# Patient Record
Sex: Male | Born: 1970 | Race: White | Hispanic: No | Marital: Single | Smoking: Current every day smoker
Health system: Southern US, Community
[De-identification: ages and names within clinical notes are randomized; demographics above are authoritative.]

## PROBLEM LIST (undated history)

## (undated) DIAGNOSIS — IMO0001 Reserved for inherently not codable concepts without codable children: Secondary | ICD-10-CM

## (undated) DIAGNOSIS — I219 Acute myocardial infarction, unspecified: Secondary | ICD-10-CM

## (undated) DIAGNOSIS — I2510 Atherosclerotic heart disease of native coronary artery without angina pectoris: Secondary | ICD-10-CM

## (undated) DIAGNOSIS — I10 Essential (primary) hypertension: Secondary | ICD-10-CM

## (undated) DIAGNOSIS — E7800 Pure hypercholesterolemia, unspecified: Secondary | ICD-10-CM

## (undated) DIAGNOSIS — I209 Angina pectoris, unspecified: Secondary | ICD-10-CM

## (undated) DIAGNOSIS — F17200 Nicotine dependence, unspecified, uncomplicated: Secondary | ICD-10-CM

## (undated) HISTORY — DX: Essential (primary) hypertension: I10

## (undated) HISTORY — DX: Reserved for inherently not codable concepts without codable children: IMO0001

## (undated) HISTORY — DX: Angina pectoris, unspecified: I20.9

## (undated) HISTORY — PX: OTHER SURGICAL HISTORY: SHX169

## (undated) HISTORY — DX: Atherosclerotic heart disease of native coronary artery without angina pectoris: I25.10

## (undated) HISTORY — DX: Acute myocardial infarction, unspecified: I21.9

## (undated) HISTORY — DX: Nicotine dependence, unspecified, uncomplicated: F17.200

## (undated) HISTORY — DX: Pure hypercholesterolemia, unspecified: E78.00

---

## 2006-09-11 ENCOUNTER — Inpatient Hospital Stay: Payer: Self-pay | Admitting: Internal Medicine

## 2010-03-02 ENCOUNTER — Emergency Department: Payer: Self-pay | Admitting: Emergency Medicine

## 2011-09-16 ENCOUNTER — Emergency Department: Payer: Self-pay | Admitting: *Deleted

## 2012-09-02 ENCOUNTER — Ambulatory Visit: Payer: Self-pay

## 2012-12-09 ENCOUNTER — Inpatient Hospital Stay: Payer: Self-pay | Admitting: Internal Medicine

## 2012-12-09 LAB — BASIC METABOLIC PANEL
Calcium, Total: 9.6 mg/dL (ref 8.5–10.1)
Co2: 31 mmol/L (ref 21–32)
Creatinine: 0.83 mg/dL (ref 0.60–1.30)
EGFR (African American): >60
Glucose: 119 mg/dL — ABNORMAL HIGH (ref 65–99)
Osmolality: 280 (ref 275–301)
Potassium: 3.5 mmol/L (ref 3.5–5.1)
Sodium: 139 mmol/L (ref 136–145)

## 2012-12-09 LAB — URINALYSIS, COMPLETE
Bacteria: NONE SEEN
Bilirubin,UR: NEGATIVE
Glucose,UR: NEGATIVE mg/dL (ref 0–75)
Ketone: NEGATIVE
Leukocyte Esterase: NEGATIVE
RBC,UR: NONE SEEN /HPF (ref 0–5)

## 2012-12-09 LAB — CBC
HCT: 44.1 % (ref 40.0–52.0)
HGB: 15.6 g/dL (ref 13.0–18.0)
MCV: 87 fL (ref 80–100)
Platelet: 328 10*3/uL (ref 150–440)
RBC: 5.04 10*6/uL (ref 4.40–5.90)
WBC: 22 10*3/uL — ABNORMAL HIGH (ref 3.8–10.6)

## 2012-12-09 LAB — CK TOTAL AND CKMB (NOT AT ARMC)
CK, Total: 855 U/L — ABNORMAL HIGH (ref 35–232)
CK-MB: 74.2 ng/mL — ABNORMAL HIGH (ref 0.5–3.6)

## 2012-12-09 LAB — TROPONIN I
Troponin-I: 4 ng/mL — ABNORMAL HIGH
Troponin-I: 26.03 ng/mL — ABNORMAL HIGH

## 2012-12-09 LAB — DRUG SCREEN, URINE
Amphetamines, Ur Screen: NEGATIVE (ref ?–1000)
Barbiturates, Ur Screen: NEGATIVE (ref ?–200)
Cocaine Metabolite,Ur ~~LOC~~: NEGATIVE (ref ?–300)
Phencyclidine (PCP) Ur S: NEGATIVE (ref ?–25)
Tricyclic, Ur Screen: NEGATIVE (ref ?–1000)

## 2012-12-09 LAB — APTT: Activated PTT: 29.8 secs (ref 23.6–35.9)

## 2012-12-10 LAB — BASIC METABOLIC PANEL
BUN: 11 mg/dL (ref 7–18)
Creatinine: 0.65 mg/dL (ref 0.60–1.30)
EGFR (African American): >60
Glucose: 100 mg/dL — ABNORMAL HIGH (ref 65–99)
Osmolality: 281 (ref 275–301)
Potassium: 3.3 mmol/L — ABNORMAL LOW (ref 3.5–5.1)
Sodium: 141 mmol/L (ref 136–145)

## 2012-12-25 ENCOUNTER — Emergency Department: Payer: Self-pay | Admitting: Emergency Medicine

## 2012-12-25 LAB — COMPREHENSIVE METABOLIC PANEL
Alkaline Phosphatase: 88 U/L (ref 50–136)
Anion Gap: 8 (ref 7–16)
BUN: 10 mg/dL (ref 7–18)
Calcium, Total: 8.5 mg/dL (ref 8.5–10.1)
Chloride: 112 mmol/L — ABNORMAL HIGH (ref 98–107)
Co2: 23 mmol/L (ref 21–32)
Creatinine: 0.55 mg/dL — ABNORMAL LOW (ref 0.60–1.30)
EGFR (African American): >60
EGFR (Non-African Amer.): >60
Osmolality: 284 (ref 275–301)
Potassium: 4 mmol/L (ref 3.5–5.1)
SGOT(AST): 22 U/L (ref 15–37)
SGPT (ALT): 31 U/L (ref 12–78)
Total Protein: 7.8 g/dL (ref 6.4–8.2)

## 2012-12-25 LAB — CBC
HGB: 14.3 g/dL (ref 13.0–18.0)
MCH: 30.2 pg (ref 26.0–34.0)
RBC: 4.75 10*6/uL (ref 4.40–5.90)
RDW: 13.7 % (ref 11.5–14.5)
WBC: 14.6 10*3/uL — ABNORMAL HIGH (ref 3.8–10.6)

## 2012-12-25 LAB — TROPONIN I: Troponin-I: <0.02 ng/mL

## 2012-12-25 LAB — ETHANOL: Ethanol: 258 mg/dL

## 2013-01-04 ENCOUNTER — Other Ambulatory Visit: Payer: Self-pay | Admitting: Internal Medicine

## 2013-01-04 LAB — CBC WITH DIFFERENTIAL/PLATELET
Basophil #: 0.1 10*3/uL (ref 0.0–0.1)
Basophil %: 1 %
Eosinophil #: 0.1 10*3/uL (ref 0.0–0.7)
Eosinophil %: 1.9 %
HCT: 40 % (ref 40.0–52.0)
HGB: 13.6 g/dL (ref 13.0–18.0)
Lymphocyte %: 28.5 %
MCH: 30.7 pg (ref 26.0–34.0)
MCHC: 33.9 g/dL (ref 32.0–36.0)
MCV: 91 fL (ref 80–100)
Monocyte #: 0.6 x10 3/mm (ref 0.2–1.0)
Neutrophil #: 4.5 10*3/uL (ref 1.4–6.5)
Platelet: 207 10*3/uL (ref 150–440)
RBC: 4.42 10*6/uL (ref 4.40–5.90)
RDW: 13.9 % (ref 11.5–14.5)
WBC: 7.3 10*3/uL (ref 3.8–10.6)

## 2013-01-04 LAB — COMPREHENSIVE METABOLIC PANEL
Albumin: 3.5 g/dL (ref 3.4–5.0)
Anion Gap: 7 (ref 7–16)
BUN: 10 mg/dL (ref 7–18)
Calcium, Total: 8.8 mg/dL (ref 8.5–10.1)
Chloride: 107 mmol/L (ref 98–107)
Co2: 28 mmol/L (ref 21–32)
Creatinine: 0.65 mg/dL (ref 0.60–1.30)
Glucose: 84 mg/dL (ref 65–99)
Osmolality: 281 (ref 275–301)
SGPT (ALT): 28 U/L (ref 12–78)
Sodium: 142 mmol/L (ref 136–145)
Total Protein: 7.1 g/dL (ref 6.4–8.2)

## 2013-01-04 LAB — LIPID PANEL
HDL Cholesterol: 74 mg/dL — ABNORMAL HIGH (ref 40–60)
Triglycerides: 79 mg/dL (ref 0–200)
VLDL Cholesterol, Calc: 16 mg/dL (ref 5–40)

## 2013-01-06 ENCOUNTER — Ambulatory Visit: Payer: Self-pay | Admitting: Internal Medicine

## 2013-01-06 LAB — CK TOTAL AND CKMB (NOT AT ARMC): CK-MB: <0.5 ng/mL — ABNORMAL LOW (ref 0.5–3.6)

## 2013-01-07 LAB — BASIC METABOLIC PANEL
Anion Gap: 7 (ref 7–16)
Calcium, Total: 8.7 mg/dL (ref 8.5–10.1)
Chloride: 108 mmol/L — ABNORMAL HIGH (ref 98–107)
Co2: 25 mmol/L (ref 21–32)
Creatinine: 0.66 mg/dL (ref 0.60–1.30)
EGFR (African American): >60
Sodium: 140 mmol/L (ref 136–145)

## 2015-03-24 NOTE — H&P (Signed)
PATIENT NAME:  Jordan [name], Jordan [name] MR#:  [id number] DATE OF BIRTH:  03/10/1971  DATE OF ADMISSION:  12/09/2012  PRIMARY CARE PHYSICIAN: Nonlocal.  REFERRING PHYSICIAN: Dr. Zenda Alpers.  CHIEF COMPLAINT: Chest pressure.  HISTORY OF PRESENT ILLNESS: The patient is a 44 year old Caucasian male with a past medical history of hypertension and nicotine dependence, was in his usual state of health until last night. The patient slept after his dinner and woke up in the middle of the night at around 1:00 a.m. with chest pressure. He felt like elephant was sitting on his chest, associated with nausea and dizziness. He has a little bit of shortness of breath, also and chest pressure is radiating to the left arm. The patient was brought into the ER and his 12-lead EKG showed normal sinus rhythm without any ST depressions or elevations. CK total was elevated at 385, CPK-MB 34, troponin-T of 4.0. Repeat 12-lead EKG was still in normal sinus rhythm without any ST depressions or elevations. The patient was diagnosed as non-STEMI. He was started on oxygen. Sublingual nitroglycerin was given, but the chest pressure was not relieved, so nitro paste 1 inch was applied to the anterior chest wall by the ER physician. Heparin drip was started and call was placed to Dr. Juliann Pares, on-call cardiologist.Hospitalist team is called to admit the patient.   During my examination, the patient was still having severe chest pressure, 8 to 9 out of 10. Another sublingual nitroglycerin was given and another inch of nitro paste is ordered. Morphine 4 mg IV was pushed and another 4 mg of morphine was ordered as the patient was having severe chest pressure. After giving a total of 2 inches of nitro paste and morphine 4 mg IV, his chest pressure was improved and he also reported that it was 2 to 3 out of 10. Nitroglycerin drip was also ordered for complete resolution of his chest pressure. As the patient is complaining of chest pain with deep inspirations,  stat CT angiogram of the chest is ordered to rule out pulmonary embolism. Also, the plan is to rule out dissection as per my discussion with Dr. Juliann Pares. The plan is to take him to cardiac cath if the CT chest is negative. At this time, the patient was taken to radiology department for stat CT chest. The patient denies any vomiting. His girlfriend or his wife is at bedside.   PAST MEDICAL HISTORY: Hypertension, low back pain.   PAST SURGICAL HISTORY: None.  ALLERGIES: The patient has no known drug allergies.   HOME MEDICATIONS: Hydrochlorothiazide 50 mg once daily.   PSYCHOSOCIAL HISTORY: He smokes one-half pack a day. Denies any alcohol. Denies any street drugs.   FAMILY HISTORY: Father has history of congestive heart failure and 3 heart surgeries.  REVIEW OF SYSTEMS: CONSTITUTIONAL: Denies any fever or fatigue. Complaining of weakness, severe chest pressure. Positive severe chest pressure. Denies weight loss, weight gain.  EYES: Denies any blurry vision, cataracts, glaucoma.  ENT: Denies tinnitus, ear pain, discharge, postnasal drip, sinus pain, difficulty in swallowing.  RESPIRATORY: Denies cough, wheezing, but complaining of painful respiration with deep inspirations. Denies COPD or pneumonia.  CARDIOVASCULAR: Complaining of midsternal chest pressure radiating to the neck and shoulder. Denies any edema. Denies palpitations, syncope or varicose veins.  GASTROINTESTINAL: Complaining of nausea. Denies vomiting, diarrhea, abdominal pain, hematemesis, melena, constipation or GERD. GENITOURINARY: Denies dysuria, hematuria, renal calculi.  ENDOCRINOLOGY: Denies polyuria, polyphagia, nocturia, thyroid problems. HEMATOLOGIC/LYMPHATIC: Denies anemia, bleeding, bruising.  MUSCULOSKELETAL: Chronic low back pain is present.  Denies any swelling, gout, redness.  NEUROLOGIC: Denies any numbness, weakness, vertigo, ataxia, dementia.  PSYCHIATRIC: Denies insomnia, ADD, OCD, anxiety.   PHYSICAL  EXAMINATION:  VITAL SIGNS: Temperature 96.7, pulse 77, respirations 18, blood pressure 120/80, pulse ox 100% on 4 liters of oxygen.  GENERAL APPEARANCE: Uncomfortable from chest pressure which is significantly improved after 2 inches of nitro paste and right now pressure/pain is 2 to 3 out of 10. HEENT: Normocephalic, atraumatic. Pupils are equally reacting to light and accommodation. The patient is moderately built and moderately nourished. No conjunctival injection. No scleral icterus. ENT: No postnasal drip. No sinus tenderness. No pharyngeal edema.  NECK: Supple. No JVD. No carotid bruits. No thyromegaly.  LUNGS: Clear to auscultation bilaterally. No accessory muscle usage. No anterior chest wall tenderness on palpation.  CARDIAC: S1, S2 normal. Regular rate and rhythm.  GASTROINTESTINAL: Soft. Bowel sounds are positive in all four quadrants. Nontender, nondistended. No masses present.  NEUROLOGIC: Awake, alert and oriented x 3. Reflexes are 2+. Motor and sensory are grossly intact.  EXTREMITIES: No edema. No cyanosis. No clubbing.  SKIN: Normal turgor. Warm to touch. No lesions, no acne, no rash.  MUSCULOSKELETAL: Positive back pain. Denies any pain or hip pain.  PSYCHIATRIC: The patient seemed to be anxious from chest pressure. Otherwise, normal affect and mood.  LABORATORY DATA AND IMAGING STUDIES: 12-lead EKG revealed normal sinus rhythm at 54 beats per minute, normal PR and QRS intervals, no ST-T wave change. No ST elevations or depressions were noticed. Inferior infarct which is age undetermined. Glucose 119, BUN 15, creatinine 0.83, sodium 139, potassium 3.5, chloride 101, CO2 of 31, anion gap 7, GFR greater than 60, serum osmolality 280, calcium 9.6. CK total 383, CPK-MB 34.0, troponin-T 4.0. WBC 22, hemoglobin 15.6, hematocrit 44.1, platelet count is 328, MCV 87. Activated PTT 29.8. Urinalysis and urine drug screen are pending. A stat CT angiogram of the chest is ordered. The patient is in  the radiology department, the result is pending at this time.   ASSESSMENT AND PLAN: A 44 year old Caucasian male presenting to the ER with severe substernal chest pressure will be admitted with the following assessment and plan.   1. Non-STEMI. Will admit the patient to CCU. ACS protocol with oxygen, nitroglycerin, aspirin, beta blocker, statin are ordered. Integrilin drip is initiated. Heparin drip is ordered by the ER physician. Nitroglycerin drip if the chest pressure is still not relieved with nitroglycerin 2 inches paste. Stat CT angiogram of the chest is ordered to rule out pulmonary embolism as well as a dissection. If CT angiogram negative, the patient will be taken to cardiac cath immediately by Dr. Juliann Paresallwood. I personally discussed with Dr. Juliann Paresallwood and he is aware of the situation.  2. Urine drug screen and urinalysis are pending.  3. Hypertension. Blood pressure is stable at this time. The patient is receiving IV fluids. He is n.p.o.  4. Nicotine dependence. The patient needs counseling once acute chest pressure is resolved.  5. Chronic low back pain, stable. Pain management as needed.  6. GI prophylaxis with Protonix.  7. DVT prophylaxis. The patient is on heparin drip. 8. He is full code.  9. Cycle cardiac biomarkers.  The diagnosis and plan of care was discussed in detail with the patient and his wife at bedside.   Total critical care time spent is 75 minutes.     ____________________________ Ramonita LabAruna Roniesha Hollingshead, MD ag:es D: 12/09/2012 07:32:14 ET T: 12/09/2012 08:08:33 ET JOB#: [id number]  cc: Ramonita LabAruna Stanislaus Kaltenbach, MD, <Dictator>  Dwayne D. Juliann Pares, MD Ramonita Lab MD ELECTRONICALLY SIGNED 12/17/2012 7:20

## 2015-03-24 NOTE — Consult Note (Signed)
PATIENT NAME:  Jordan [name], ORSAK MR#:  [id number] DATE OF BIRTH:  10/14/1971  DATE OF CONSULTATION:  12/10/2012  REFERRING PHYSICIAN:  Dr. Vear Clock, referred by the hospitalist CONSULTING PHYSICIAN:  Aidee Latimore D. Cheryal Salas, MD  INDICATION: Non-Q-wave myocardial infarction, probably inferior wall myocardial infarction, with unstable angina.   HISTORY OF PRESENT ILLNESS: The patient is a 44 year old white male with a history of smoking, hypertension, has had two episodes of severe chest pain. A few days ago he had substernal chest pressure while asleep.  The pain was off and on, but he did not seek medical attention at this time. While he was asleep, he had some significant chest pain around 1:00 a.m., chest pressure, felt like an elephant on his chest, associated with nausea and dizziness. He got a little bit short of breath. Pain was 10 out of 10. This was his second episode. He finally came to the Emergency Room for evaluation. He denied any prior cardiac history. EKG showed nonspecific ST-T wave changes with some ST depression, with elevated troponin and elevated CKs, so he was advised to be admitted for further evaluation. He was treated with anticoagulation and aspirin, pain control with narcotics as necessary, and admission to the Intensive Care Unit.   REVIEW OF SYSTEMS: He denies significant blackout spells or syncope. He has had nausea, no real vomiting. No fever, chills, or sweats. No weight loss, no weight gain, hemoptysis, hematemesis. He denies bright red blood per rectum. No vision change or hearing change. Denies sputum production. Denies cough.   PAST MEDICAL HISTORY: Hypertension, smoking, mild obesity.   PAST SURGICAL HISTORY: None.   MEDICATIONS: He is on HCTZ 50 mg a day.   ALLERGIES: None.   SOCIAL HISTORY: Married, smoker. He works in Aeronautical engineer.   FAMILY HISTORY: Positive for coronary artery disease, angina, coronary artery bypass surgery, congestive heart failure.  PHYSICAL  EXAMINATION: VITAL SIGNS: Blood pressure 120/80, pulse 75, respiratory rate 16, afebrile.  HEENT: Normocephalic, atraumatic. Pupils are equal and reactive to light.  NECK: Supple. No significant jugular venous distention, bruits, or adenopathy.  LUNGS: Clear to auscultation and percussion. No significant wheeze, rhonchi, or rales.  HEART: Regular rate and rhythm. Systolic ejection murmur at left sternal border. Positive S3. Soft S4. PMI is nondisplaced. laterally   benign.  ABDOMEN: Positive bowel sounds. No rebound, guarding, or tenderness.  NEUROLOGIC: Intact.  SKIN: Normal.   LABORATORY, DIAGNOSTIC AND RADIOLOGICAL DATA:  EKG: Normal sinus rhythm, rate of 60, diffuse nonspecific ST-T wave changes, possible inferior infarct, age unclear. This may be related to this event.   Glucose 119, BUN 15, creatinine 0.83, sodium 139, potassium 3.5, chloride 109, calcium 96, CK 383, MB 34. Troponin 4, white count of 22, hemoglobin 15.6, hematocrit 44, platelet count 328, PT, PTT normal. Urinalysis unremarkable. CT of the chest unremarkable.   ASSESSMENT: Non-STEMI, unstable angina, angina, probable coronary artery disease, abnormal EKG, elevated cardiac enzymes, smoking, hypertension.    PLAN:   1. I agree with admit, place in intensive care unit, rule out for myocardial infarction. Follow-up cardiac enzymes. Follow-up EKG. Anticoagulation with heparin. I would probably add Integrilin because of increased cardiac enzymes. Nitrates as necessary for pain. Narcotics to help with pain syndrome at this point. I will probably proceed with echocardiogram and consider cardiac cath within 24 to 48 hours.  2. Hypertension:  Continue hypertension control, probably switch from HCTZ and place on beta blocker, probably ACE inhibitor if he can tolerate it.  3. I advised the patient to  quit smoking. Nicotine patch for now.  4. Chronic low back pain: Continue pain management as necessary      Consider drug screen as  this is a young patient with a cardiac event. Again, we will consider statin therapy, proceed with further evaluation and management of what appears to be acute coronary syndrome.   ____________________________ Bobbie Stackwayne D. Juliann Paresallwood, MD ddc:cb D: 12/10/2012 10:06:06 ET T: 12/10/2012 10:22:54 ET JOB#: [id number]  cc: Tayja Manzer D. Juliann Paresallwood, MD, <Dictator> Alwyn PeaWAYNE D Diesha Rostad MD ELECTRONICALLY SIGNED 12/31/2012 16:32

## 2015-03-24 NOTE — Consult Note (Signed)
Chief Complaint:   Subjective/Chief Complaint Pt states to be doing better today. He denies sob or CP. He ambulated well. No groin issues.   VITAL SIGNS/ANCILLARY NOTES: **Vital Signs.:   09-Jan-14 07:00   Vital Signs Type Routine   Temperature Temperature (F) 97.7   Celsius 36.5   Temperature Source oral   Pulse Pulse 70   Respirations Respirations 20   Systolic BP Systolic BP 500   Diastolic BP (mmHg) Diastolic BP (mmHg) 75   Mean BP 87   Pulse Ox % Pulse Ox % 99   Pulse Ox Activity Level  At rest   Oxygen Delivery Room Air/ 21 %   Pulse Ox Heart Rate 72  *Intake and Output.:   09-Jan-14 07:00   Grand Totals Intake:  13.8 Output:      Net:  13.8 24 Hr.:  1895.2   Integrelin      In:  13.8   Brief Assessment:   Cardiac Regular  murmur present  -- LE edema  -- JVD  --Gallop    Respiratory normal resp effort  clear BS    Gastrointestinal Normal    Gastrointestinal details normal Soft  No rigidity    Additional Physical Exam Right groin ok   Lab Results: Routine Chem:  09-Jan-14 04:43    Glucose, Serum  100   BUN 11   Creatinine (comp) 0.65   Sodium, Serum 141   Potassium, Serum  3.3   Chloride, Serum  110   CO2, Serum 24   Calcium (Total), Serum  8.4   Anion Gap 7   Osmolality (calc) 281   eGFR (African American) >60   eGFR (Non-African American) >60 (eGFR values <85m/min/1.73 m2 may be an indication of chronic kidney disease (CKD). Calculated eGFR is useful in patients with stable renal function. The eGFR calculation will not be reliable in acutely ill patients when serum creatinine is changing rapidly. It is not useful in  patients on dialysis. The eGFR calculation may not be applicable to patients at the low and high extremes of body sizes, pregnant women, and vegetarians.)   Cholesterol, Serum 146   Triglycerides, Serum 142   HDL (INHOUSE) 44   VLDL Cholesterol Calculated 28   LDL Cholesterol Calculated 74 (Result(s) reported on 10 Dec 2012 at  06:17AM.)   Radiology Results: XRay:    08-Jan-14 04:30, Chest PA and Lateral   Chest PA and Lateral    REASON FOR EXAM:    chest pain  COMMENTS:   LMP: (Male)    PROCEDURE: DXR - DXR CHEST PA (OR AP) AND LATERAL  - Dec 09 2012  4:30AM     RESULT: Comparison: None    Findings:     PA and lateral chest radiographs are provided.  There is no focal   parenchymal opacity, pleural effusion, or pneumothorax. The heart and   mediastinum are unremarkable.  The osseous structures are unremarkable.    IMPRESSION:   No acute disease of the chest.    Dictation Site: 1        Verified By: HJennette Banker M.D., MD  Cardiac Catherization:    08-Jan-14 10:03, Cardiac Catheterization   Cardiac Catheterization    [facility] [address] [place] [id number] ([phone]    Cardiovascular Catheterization Comprehensive Report     Patient: RDanish Ruffins Study date: 12/09/2012  MR number: [id number] Account number: [id number]    DOB: [birth year]  Age: 44 years  Gender: Male  Race: White  Height: 66.1 in  Weight: 189.6 lb     Diagnostic Cardiologist:  Lujean Amel, MD  Interventional Cardiologist:  Lujean Amel, MD     SUMMARY:     -CARDIAC STRUCTURES: Analysis of regional contractile function  demonstrated mild diaphragmatic hypokinesis and moderate posterobasal  hypokinesis.     -1ST LESION INTERVENTIONS: A drug-eluting stent was performed on the  100 % lesion in the mid RCA. Following intervention there was a 0 %  residual stenosis.     -Summary: Preseverd overall LVF  EF=55%  Inf /Post hypo  Cors  L[place] OK  LAD 75/75 mid LAD  Circ 40 mid  RCA 100 mid  Successful PCI stent to mid RCA 100->0%  4.0-28mm post dilated with 5.0-12mm Clever trex to 22atm  Consider staging mid LAD     CORONARY CIRCULATION: The coronary circulation is right dominant. Mid  LAD: There was a tubular 75 % stenosis. Distal LAD: There was a 75 %  stenosis. Proximal  circumflex: There was a 40 % stenosis. Mid RCA:  There was a 100 % stenosis.     VENTRICLES:Analysis of regional contractile function demonstrated  mild diaphragmatic hypokinesis and moderate posterobasal hypokinesis.     HISTORY: No history of previous myocardial infarction. There was no  prior diagnosis of congestive heart failure. The patient has  hypertension, a history of current cigarette use, and a family  history of coronary artery disease. There was no history of  cerebrovascular disease, peripheral arterial disease, chronic lung  disease, or diabetes. PRIOR CARDIOVASCULAR PROCEDURES: No history of  valve surgery, coronary or graft percutaneous intervention, or  coronary bypass surgery.     PRIOR DIAGNOSTIC TEST RESULTS: No prior stress test is available.     PROCEDURES PERFORMED: Left heart catheterization with  ventriculography. Procedure: Successful Closure with Mynx  Intervention on mid RCA: drug-eluting stent.     PROCEDURE: The risks and alternatives of the procedures and conscious  sedation were explained to the patient and informed consent was  obtained. The patient was brought to the cath lab and placed on the  table. The planned puncture sites were prepped and draped in the  usual sterile fashion.     -ACT measurement.     -ACT measurement.     -Right femoral artery access. The puncture site was infiltrated with  20 ml 1 % lidocaine. The vessel was accessed using the modified  Seldinger technique, a wire was threaded into the vessel, and a 5 Fr  x 11 cm Avanti sheath was advanced over the wire into the vessel.     -Left heart catheterization. A catheter was advanced to the ascending  aorta. Ventriculography was performed using power injection of  contrast agent.     -Successful Closure with Mynx.     LESION INTERVENTION: A drug-eluting stent was performed on the 100 %  lesion in the mid RCA. Following intervention there was a 0 %  residual stenosis.      -Vessel setup was performed. A HTF BMW .014 x 190cm wire was used to  cross the lesion.     -Vessel setup was performed. A 26F JR 4 guiding catheter was used to  intubate the vessel.     -Balloonangioplasty was performed, using a Rx Trek 3.0 x 27m  balloon, with 3 inflation(s) and a maximum inflation pressure of 14  atm.     -A Xience EX 4.0 x 28MM drug-eluting stent  was placed across the  lesion and deployed at a maximum inflation pressureof 11 atm.     -Balloon angioplasty was performed, with 2 inflation(s) and a maximum  inflation pressure of 18 atm.     -Balloon angioplasty was performed, using a [place] Trek 5.0 x 58m  balloon, with 3 inflation(s) and a maximum inflation pressure of 22  atm.     PROCEDURE COMPLETION: TIMING: Test started at 10:16. Test concluded at  11:13. RADIATION EXPOSURE: Fluoroscopy time: 15.8 min. Fluoroscopy  dose: 4.92 Gray.  MEDICATIONS GIVEN: Fentanyl, 50 mcg, IV, at 10:15. Midazolam, 1 mg,  IV, at 10:16. Midazolam, 1 mg, IV, at 10:19. Fentanyl, 50 mcg, IV, at  10:19. Nitroglycerin, 100 mcg, intracoronary, at 10:36.  Nitroglycerin, 200 mcg, intracoronary, at 11:02. Heparin, 3000 units,  IV, last dose at 10:36. Eptifibatide Bolus, 7.9 ml, at 10:40.  Ticagrelor (Brilinta), 180 mg, PO, at 11:16.  CONTRAST GIVEN: Isovue 335 ml.     Prepared and signed by     DLujean Amel MD  Signed 12/09/2012 14:15:20     STUDY DIAGRAM     Angiographic findings  Native coronary lesions:   Mid LAD: Lesion 1: tubular, 75 % stenosis.   Distal LAD: Lesion 1: 75 % stenosis.   Proximal circumflex: Lesion 1: 40 % stenosis.   Mid RCA: Lesion 1: 100 % stenosis.  Intervention results  Native coronary lesions:  drug-eluting stent of the 100 % stenosis in mid RCA. 0 % residual  stenosis. Stent: Xience EX 4.0 x 28MM drug-eluting.     HEMODYNAMIC TABLES     Pressures:  Baseline  Pressures:  - HR: 76  Pressures:  - Rhythm:  Pressures:  -- Aortic Pressure (S/D/M):  96/56/71  Pressures:  -- Left Ventricle (s/edp): 100/17/--     Outputs:  Baseline  Outputs:  -- CALCULATIONS[age]  Outputs:  -- CALCULATIONS: Body Surface Area: 1.96  Outputs:  -- CALCULATIONS: Height in cm: 168.00  Outputs:  -- CALCULATIONS: Sex: Male  Outputs:  -- CALCULATIONS: Weight in kg: 86.20  Cardiology:    08-Jan-14 04:16, ECG   Ventricular Rate 69   Atrial Rate 69   P-R Interval 120   QRS Duration 86   QT 386   QTc 413   P Axis 63   R Axis 0   T Axis 57   ECG interpretation    Normal sinus rhythm  Possible Inferior infarct , age undetermined  Abnormal ECG  When compared with ECG of 16-Sep-2011 09:15,  Borderline criteria for Inferior infarct are now Present  ST elevation has replaced ST depression in Inferior leads  ----------unconfirmed----------  Confirmed by OVERREAD, NOT ([id number]), editor PEARSON, BARBARA ([id number]) on 12/10/2012 8:47:59 AM   ECG     08-Jan-14 05:27, ECG   Ventricular Rate 64   Atrial Rate 64   P-R Interval 126   QRS Duration 84   QT 410   QTc 422   P Axis 47   R Axis -13   T Axis 71   ECG interpretation    Normal sinus rhythm  Inferior infarct , age undetermined  Abnormal ECG  When compared with ECG of 16-Sep-2011 09:15,  PR interval has increased  Vent. rate has decreased BY  36 BPM  Inferior infarct is now Present  ----------unconfirmed----------  Confirmed by OVERREAD, NOT ([id number]), editor PEARSON, BARBARA ([id number] on 12/10/2012 8:48:05 AM   ECG     08-Jan-14 07:40, ECG   Ventricular Rate 64  Atrial Rate 64   P-R Interval 126   QRS Duration 88   QT 418   QTc 431   P Axis 45   R Axis -6   T Axis 69   ECG interpretation    Normal sinus rhythm  Inferior infarct (cited on or before 16-Sep-2011)  Abnormal ECG  When compared with ECG of 09-Dec-2012 05:27,  Nonspecific T wave abnormality, worse in Inferior leads  Nonspecific T wave abnormality, worse in Lateral leads  ----------unconfirmed----------  Confirmed by OVERREAD,  NOT ([id number]), editor PEARSON, BARBARA ([id number]) on 12/10/2012 8:48:59 AM   ECG   CT:    08-Jan-14 07:21, CT Chest for Pulm Embolism With Contrast   CT Chest for Pulm Embolism With Contrast    REASON FOR EXAM:    CHEST PAIN R/O PE and dissection  COMMENTS:       PROCEDURE: CT  - CT CHEST (FOR PE) W  - Dec 09 2012  7:21AM     RESULT: Axial CT scanning was performed through the chest with   reconstructions at 3 mm intervals and slice thicknesses. The patient   received 100 cc of Isovue-370. Review of multiplanar reconstructed images   was performed separately on the VIA monitor.    The caliber of the thoracic aorta is normal. There is no evidence of a   false lumen. Contrast within the pulmonary arterial tree is normal. There   are no filling defects to suggest an acute pulmonary embolism. The   cardiac chambers are normal in size. There is no mediastinal nor hilar   lymphadenopathy. There is no pleural nor pericardial effusion. The     thoracic esophagus is of normal caliber.    At lung window settings there are no interstitial nor alveolar   infiltrates. No pulmonary parenchymal masses are demonstrated. Within the   upper abdomen the observed portions of the liver spleen and gallbladder   appear normal. There are no adrenal masses.    The thoracic vertebral bodies are preserved in height. The sternum   exhibits no acute abnormality. No acute rib abnormality is demonstrated.    IMPRESSION:   1. There is no evidence of an acute pulmonary embolism nor acute thoracic   aortic pathology.  2. There is no evidence of CHF nor of pneumonia.  3. There is no evidence of a pneumothorax nor pleural effusion.  4. The bony thorax exhibits no acute abnormality.     Dictation Site: 1        Verified By: DAVID A. Martinique, M.D., MD   Assessment/Plan:  Invasive Device Daily Assessment of Necessity:   Does the patient currently have any of the following indwelling devices? none   Assessment/Plan:    Assessment IMP S/P IMI non stemi [place] Angina HTN Smoking DJD back S/P PCI stent to mid RCA CAD Hyperlipidemia .    Plan PLAN Ambulate in the hall Agree with coreg 3.125 mg twice a day asa 81 mg once a day Plavix 75 mg once a day Statin for chol Advise to quit smoking Consider staging 75/75 mid LAD F/U outpt 1-2 weeks RTW 1-2 weeks   Electronic Signatures: Lujean Amel D (MD)  (Signed 09-Jan-14 12:49)  Authored: Chief Complaint, VITAL SIGNS/ANCILLARY NOTES, Brief Assessment, Lab Results, Radiology Results, Assessment/Plan   Last Updated: 09-Jan-14 12:49 by Lujean Amel D (MD)

## 2015-03-24 NOTE — Discharge Summary (Signed)
PATIENT NAME:  Jordan [name], Jordan [name] MR#:  [id number] DATE OF BIRTH:  [birth year]  DATE OF ADMISSION:  12/09/2012 DATE OF DISCHARGE:  12/10/2012  ADMISSION DIAGNOSIS: Non-ST-elevation myocardial infarction.   DISCHARGE DIAGNOSIS: Non-ST-elevation myocardial infarction.   CONSULTS: Dr. Juliann Paresallwood.   PROCEDURES: The patient underwent a cardiac catheterization on 12/09/2012 which showed EF of 55%. He had a 100% lesion in the mid-RCA. Following a drug-eluting stent, there is 0% residual stenosis. LAD had75% stenosis, distal LAD 75% stenosis, proximal circumflex 40% stenosis.   LDL was 74, VLDL 28, HDL 44, triglycerides 142, cholesterol 146. Sodium 141, potassium 3.3, chloride 110, bicarb 24, BUN 11, creatinine 0.65. Troponin max was 20.   HOSPITAL COURSE: This is a 44 year old male who presented with chest pain. For further details, please refer to H and P.   1. Non-ST-elevation MI. The patient's troponin was at 20 and probably still climbing. He underwent a cardiac catheterization which revealed 100% lesion in the RCA. His LAD also with 75% occlusion. He received a drug-eluting stent, was placed on Integrilin initially, transitioned to Plavix and aspirin, as well as statin and low-dose beta blocker. The patient was seen by Dr. Juliann Paresallwood. The patient will have followup in one week with Dr. Juliann Paresallwood.  2. Urine drug screen was positive for benzos and marijuana.  3. Hypertension. Blood pressure is stable.  4. Nicotine dependence. The patient does want to quit smoking. He was counseled for 3 minutes and discharged on a patch. C 5. Chronic lower back pain which is stable.   DISCHARGE MEDICATIONS:  1. Nitroglycerin sublingual 0.4 mg p.r.n. chest pain.  2. Simvastatin 10 mg at bedtime.  3. Aspirin 81 mg daily. 4. Plavix 75 mg daily.  5. Coreg 3.125 b.i.d.  6. Nicotine patch 21 mg per 24 hours.   DISCHARGE DIET: Low sodium.   DISCHARGE ACTIVITY: No exertion, no heavy lifting until followup with Dr. Juliann Paresallwood.  He may remove his dressing in the a.m. and may shower in the a.m.   TIME SPENT: Approximately 35 minutes.     ____________________________ Janyth ContesSital P. Juliene PinaMody, MD spm:es D: 12/10/2012 13:41:35 ET T: 12/10/2012 15:43:36 ET JOB#: [id number]  cc: Zaydyn Havey P. Juliene PinaMody, MD, <Dictator> Dwayne D. Juliann Paresallwood, MD Janyth ContesSITAL P Kristoffer Bala MD ELECTRONICALLY SIGNED 12/10/2012 19:50

## 2016-02-22 ENCOUNTER — Inpatient Hospital Stay: Payer: Managed Care, Other (non HMO)

## 2016-02-22 ENCOUNTER — Encounter: Payer: Self-pay | Admitting: Oncology

## 2016-02-22 ENCOUNTER — Inpatient Hospital Stay: Payer: Managed Care, Other (non HMO) | Attending: Oncology | Admitting: Oncology

## 2016-02-22 VITALS — BP 183/99 | HR 87 | Temp 98.3°F | Resp 18 | Ht 67.32 in | Wt 192.5 lb

## 2016-02-22 DIAGNOSIS — R591 Generalized enlarged lymph nodes: Secondary | ICD-10-CM | POA: Diagnosis not present

## 2016-02-22 DIAGNOSIS — Z79899 Other long term (current) drug therapy: Secondary | ICD-10-CM | POA: Insufficient documentation

## 2016-02-22 DIAGNOSIS — D72829 Elevated white blood cell count, unspecified: Secondary | ICD-10-CM | POA: Diagnosis not present

## 2016-02-22 DIAGNOSIS — E7800 Pure hypercholesterolemia, unspecified: Secondary | ICD-10-CM | POA: Diagnosis not present

## 2016-02-22 DIAGNOSIS — F17210 Nicotine dependence, cigarettes, uncomplicated: Secondary | ICD-10-CM | POA: Diagnosis not present

## 2016-02-22 DIAGNOSIS — Z7982 Long term (current) use of aspirin: Secondary | ICD-10-CM | POA: Insufficient documentation

## 2016-02-22 DIAGNOSIS — I10 Essential (primary) hypertension: Secondary | ICD-10-CM | POA: Diagnosis not present

## 2016-02-22 DIAGNOSIS — R599 Enlarged lymph nodes, unspecified: Secondary | ICD-10-CM

## 2016-02-22 LAB — COMPREHENSIVE METABOLIC PANEL
ALBUMIN: 4.4 g/dL (ref 3.5–5.0)
ALK PHOS: 85 U/L (ref 38–126)
ALT: 37 U/L (ref 17–63)
AST: 30 U/L (ref 15–41)
Anion gap: 8 (ref 5–15)
BILIRUBIN TOTAL: 0.6 mg/dL (ref 0.3–1.2)
BUN: 11 mg/dL (ref 6–20)
CALCIUM: 8.8 mg/dL — AB (ref 8.9–10.3)
CO2: 24 mmol/L (ref 22–32)
Chloride: 101 mmol/L (ref 101–111)
Creatinine, Ser: 0.75 mg/dL (ref 0.61–1.24)
GFR calc Af Amer: >60 mL/min (ref >60)
GFR calc non Af Amer: >60 mL/min (ref >60)
GLUCOSE: 103 mg/dL — AB (ref 65–99)
POTASSIUM: 3.7 mmol/L (ref 3.5–5.1)
SODIUM: 133 mmol/L — AB (ref 135–145)
TOTAL PROTEIN: 7.8 g/dL (ref 6.5–8.1)

## 2016-02-22 LAB — CBC WITH DIFFERENTIAL/PLATELET
BASOS ABS: 0.1 10*3/uL (ref 0–0.1)
BASOS PCT: 1 %
Eosinophils Absolute: 0.1 10*3/uL (ref 0–0.7)
Eosinophils Relative: 1 %
HEMATOCRIT: 48.7 % (ref 40.0–52.0)
HEMOGLOBIN: 17.1 g/dL (ref 13.0–18.0)
Lymphocytes Relative: 17 %
Lymphs Abs: 1.9 10*3/uL (ref 1.0–3.6)
MCH: 30 pg (ref 26.0–34.0)
MCHC: 35.2 g/dL (ref 32.0–36.0)
MCV: 85.4 fL (ref 80.0–100.0)
Monocytes Absolute: 0.7 10*3/uL (ref 0.2–1.0)
Monocytes Relative: 7 %
NEUTROS ABS: 8.1 10*3/uL — AB (ref 1.4–6.5)
NEUTROS PCT: 74 %
Platelets: 257 10*3/uL (ref 150–440)
RBC: 5.71 MIL/uL (ref 4.40–5.90)
RDW: 13 % (ref 11.5–14.5)
WBC: 10.9 10*3/uL — AB (ref 3.8–10.6)

## 2016-02-22 LAB — LACTATE DEHYDROGENASE: LDH: 164 U/L (ref 98–192)

## 2016-02-22 NOTE — Progress Notes (Signed)
Patient is [facility] for Axillary adenopathy and Inguinal lymphadenopathy he has been having for over a month.  He reports that his constant pain is 10/10 on pain scale.

## 2016-02-27 ENCOUNTER — Ambulatory Visit
Admission: RE | Admit: 2016-02-27 | Discharge: 2016-02-27 | Disposition: A | Discharge status: Home / Self Care | Payer: Managed Care, Other (non HMO) | Source: Ambulatory Visit | Attending: Oncology | Admitting: Oncology

## 2016-02-27 DIAGNOSIS — K760 Fatty (change of) liver, not elsewhere classified: Secondary | ICD-10-CM | POA: Insufficient documentation

## 2016-02-27 DIAGNOSIS — K8020 Calculus of gallbladder without cholecystitis without obstruction: Secondary | ICD-10-CM | POA: Diagnosis not present

## 2016-02-27 DIAGNOSIS — R599 Enlarged lymph nodes, unspecified: Secondary | ICD-10-CM

## 2016-02-27 DIAGNOSIS — R591 Generalized enlarged lymph nodes: Secondary | ICD-10-CM | POA: Diagnosis present

## 2016-02-27 LAB — COMP PANEL: LEUKEMIA/LYMPHOMA

## 2016-02-27 MED ORDER — IOPAMIDOL (ISOVUE-370) INJECTION 76%
100.0000 mL | Freq: Once | INTRAVENOUS | Status: AC | PRN
  Administered 2016-02-27: 100 mL via INTRAVENOUS

## 2016-02-28 ENCOUNTER — Telehealth: Payer: Self-pay | Admitting: *Deleted

## 2016-02-28 NOTE — Telephone Encounter (Signed)
Called to ask for results from labs drawn last week ( he has fu 4/6 for results) and to ask for a note for work since he has been out for so long.

## 2016-02-29 NOTE — Telephone Encounter (Signed)
Lab work and CT look ok. I still need to complete his note.

## 2016-02-29 NOTE — Telephone Encounter (Signed)
Per Dr Harland GermanFiinnegan, can only give a note for the day he was seen in office and day he had CT done. I called pt who states he went back to work today for a few hours and was told he does not need a note.Marland Kitchen. He thanked me for checking on this

## 2016-03-07 ENCOUNTER — Inpatient Hospital Stay: Payer: Managed Care, Other (non HMO) | Attending: Oncology | Admitting: Oncology

## 2016-03-07 VITALS — BP 166/99 | Temp 98.1°F | Resp 16 | Wt 187.8 lb

## 2016-03-07 DIAGNOSIS — E7800 Pure hypercholesterolemia, unspecified: Secondary | ICD-10-CM | POA: Diagnosis not present

## 2016-03-07 DIAGNOSIS — D72829 Elevated white blood cell count, unspecified: Secondary | ICD-10-CM | POA: Diagnosis not present

## 2016-03-07 DIAGNOSIS — I2510 Atherosclerotic heart disease of native coronary artery without angina pectoris: Secondary | ICD-10-CM | POA: Diagnosis not present

## 2016-03-07 DIAGNOSIS — R591 Generalized enlarged lymph nodes: Secondary | ICD-10-CM | POA: Diagnosis not present

## 2016-03-07 DIAGNOSIS — I208 Other forms of angina pectoris: Secondary | ICD-10-CM | POA: Diagnosis not present

## 2016-03-07 DIAGNOSIS — R5383 Other fatigue: Secondary | ICD-10-CM | POA: Insufficient documentation

## 2016-03-07 DIAGNOSIS — I10 Essential (primary) hypertension: Secondary | ICD-10-CM | POA: Insufficient documentation

## 2016-03-07 DIAGNOSIS — K760 Fatty (change of) liver, not elsewhere classified: Secondary | ICD-10-CM | POA: Insufficient documentation

## 2016-03-07 DIAGNOSIS — K8020 Calculus of gallbladder without cholecystitis without obstruction: Secondary | ICD-10-CM | POA: Insufficient documentation

## 2016-03-07 DIAGNOSIS — Z7982 Long term (current) use of aspirin: Secondary | ICD-10-CM | POA: Diagnosis not present

## 2016-03-07 DIAGNOSIS — Z79899 Other long term (current) drug therapy: Secondary | ICD-10-CM | POA: Diagnosis not present

## 2016-03-07 DIAGNOSIS — R599 Enlarged lymph nodes, unspecified: Secondary | ICD-10-CM

## 2016-03-07 DIAGNOSIS — I252 Old myocardial infarction: Secondary | ICD-10-CM | POA: Diagnosis not present

## 2016-03-07 NOTE — Progress Notes (Signed)
Patient's groin pain has improved and only has it on occasion and usually after a full day at work.

## 2016-03-07 NOTE — Progress Notes (Signed)
[facility]  Telephone:(336) [phone] Fax:(336) [phone]  ID: Jordan [name] OB: [birth year]  MR#: [id number]  QIO#:[id number]  Patient Care Team: Bobby Rumpf, MD as PCP - General (Family Medicine)  CHIEF COMPLAINT:  Chief Complaint  Patient presents with  . Results    INTERVAL HISTORY: Patient returns to clinic today for further evaluation and discussion of his imaging results. He i continues to have pain particularly in his right groin, but otherwise feels well. He denies any fevers, night sweats, or weight loss. He has no neurologic complaints. He denies any chest pain or shortness of breath. He denies any nausea, vomiting, constipation, or diarrhea. He has no urinary complaints. Patient offers no further specific complaints.  REVIEW OF SYSTEMS:   Review of Systems  Constitutional: Negative for fever, weight loss, malaise/fatigue and diaphoresis.  Respiratory: Negative.   Cardiovascular: Negative.   Gastrointestinal: Negative.  Negative for blood in stool and melena.  Genitourinary: Negative.   Musculoskeletal: Negative.   Neurological: Negative.  Negative for weakness.  Endo/Heme/Allergies: Does not bruise/bleed easily.    As per HPI. Otherwise, a complete review of systems is negatve.  PAST MEDICAL HISTORY: Past Medical History  Diagnosis Date  . Coronary disease   . Hypertension   . Angina pectoris (HCC)   . Smoking   . Hypercholesterolemia   . Myocardial infarction [facility])     PAST SURGICAL HISTORY: Past Surgical History  Procedure Laterality Date  . Pci and stent x 2 with des      FAMILY HISTORY: Reviewed and unchanged. No reported history of malignancy or chronic disease.     ADVANCED DIRECTIVES:    HEALTH MAINTENANCE: Social History  Substance Use Topics  . Smoking status: Current Every Day Smoker -- 1.00 packs/day for 20 years    Types: Cigarettes  . Smokeless tobacco: Not on file  . Alcohol Use: 0.0 oz/week    0 Standard drinks or  equivalent per week     Colonoscopy:  PAP:  Bone density:  Lipid panel:  No Known Allergies  Current Outpatient Prescriptions  Medication Sig Dispense Refill  . aspirin EC 81 MG tablet Take by mouth.    . clopidogrel (PLAVIX) 75 MG tablet     . simvastatin (ZOCOR) 10 MG tablet Take by mouth.     No current facility-administered medications for this visit.    OBJECTIVE: Filed Vitals:   03/07/16 0905  BP: 166/99  Temp: 98.1 F (36.7 C)  Resp: 16     Body mass index is 29.14 kg/(m^2).    ECOG FS:0 - Asymptomatic  General: Well-developed, well-nourished, no acute distress. Eyes: Pink conjunctiva, anicteric sclera. Lungs: Clear to auscultation bilaterally. Heart: Regular rate and rhythm. No rubs, murmurs, or gallops. Abdomen: Soft, nontender, nondistended. No organomegaly noted, normoactive bowel sounds. Musculoskeletal: No edema, cyanosis, or clubbing. Neuro: Alert, answering all questions appropriately. Cranial nerves grossly intact. Skin: No rashes or petechiae noted. Psych: Normal affect. Lymphatics: No palpable lymphadenopathy.  LAB RESULTS:  Lab Results  Component Value Date   NA 133* 02/22/2016   K 3.7 02/22/2016   CL 101 02/22/2016   CO2 24 02/22/2016   GLUCOSE 103* 02/22/2016   BUN 11 02/22/2016   CREATININE 0.75 02/22/2016   CALCIUM 8.8* 02/22/2016   PROT 7.8 02/22/2016   ALBUMIN 4.4 02/22/2016   AST 30 02/22/2016   ALT 37 02/22/2016   ALKPHOS 85 02/22/2016   BILITOT 0.6 02/22/2016   GFRNONAA >60 02/22/2016   GFRAA >60 02/22/2016  Lab Results  Component Value Date   WBC 10.9* 02/22/2016   NEUTROABS 8.1* 02/22/2016   HGB 17.1 02/22/2016   HCT 48.7 02/22/2016   MCV 85.4 02/22/2016   PLT 257 02/22/2016     STUDIES: Ct Chest W Contrast  02/27/2016  CLINICAL DATA:  Pain, swelling and mass in right pelvis and left axilla for several weeks. Fatigue. EXAM: CT CHEST, ABDOMEN, AND PELVIS WITH CONTRAST TECHNIQUE: Multidetector CT imaging of the  chest, abdomen and pelvis was performed following the standard protocol during bolus administration of intravenous contrast. CONTRAST:  100 mL Isovue 370 COMPARISON:  Chest CT on 12/09/2012 FINDINGS: CT CHEST FINDINGS Mediastinum/Lymph Nodes: No masses, pathologically enlarged lymph nodes, or other significant abnormality. No axillary lymphadenopathy identified. No other chest wall mass demonstrated. Heart size is normal. Coronary artery calcification noted. Lungs/Pleura: No pulmonary mass, infiltrate, or effusion. Musculoskeletal: No chest wall mass or suspicious bone lesions identified. CT ABDOMEN PELVIS FINDINGS Hepatobiliary: Mild hepatic steatosis noted. No liver masses identified. Tiny calcified gallstones seen, however there is no evidence of cholecystitis or biliary ductal dilatation. Pancreas: No mass, inflammatory changes, or other significant abnormality. Spleen: Within normal limits in size and appearance. Adrenals/Urinary Tract: No masses identified. No evidence of hydronephrosis. Stomach/Bowel: No evidence of obstruction, inflammatory process, or abnormal fluid collections. Normal appendix visualized. Vascular/Lymphatic: No pathologically enlarged lymph nodes. No evidence of abdominal aortic aneurysm. Aortic atherosclerotic plaque noted. Reproductive: No mass or other significant abnormality. Other: No evidence of inguinal lymphadenopathy, mass, or hernia. Bowel Musculoskeletal:  No suspicious bone lesions identified. IMPRESSION: No evidence of lymphadenopathy or other masses within the chest, abdomen, or pelvis. Mild hepatic steatosis. Cholelithiasis. No radiographic evidence of cholecystitis. Electronically Signed   By: Myles RosenthalJohn  Stahl M.D.   On: 02/27/2016 17:41   Ct Abdomen Pelvis W Contrast  02/27/2016  CLINICAL DATA:  Pain, swelling and mass in right pelvis and left axilla for several weeks. Fatigue. EXAM: CT CHEST, ABDOMEN, AND PELVIS WITH CONTRAST TECHNIQUE: Multidetector CT imaging of the  chest, abdomen and pelvis was performed following the standard protocol during bolus administration of intravenous contrast. CONTRAST:  100 mL Isovue 370 COMPARISON:  Chest CT on 12/09/2012 FINDINGS: CT CHEST FINDINGS Mediastinum/Lymph Nodes: No masses, pathologically enlarged lymph nodes, or other significant abnormality. No axillary lymphadenopathy identified. No other chest wall mass demonstrated. Heart size is normal. Coronary artery calcification noted. Lungs/Pleura: No pulmonary mass, infiltrate, or effusion. Musculoskeletal: No chest wall mass or suspicious bone lesions identified. CT ABDOMEN PELVIS FINDINGS Hepatobiliary: Mild hepatic steatosis noted. No liver masses identified. Tiny calcified gallstones seen, however there is no evidence of cholecystitis or biliary ductal dilatation. Pancreas: No mass, inflammatory changes, or other significant abnormality. Spleen: Within normal limits in size and appearance. Adrenals/Urinary Tract: No masses identified. No evidence of hydronephrosis. Stomach/Bowel: No evidence of obstruction, inflammatory process, or abnormal fluid collections. Normal appendix visualized. Vascular/Lymphatic: No pathologically enlarged lymph nodes. No evidence of abdominal aortic aneurysm. Aortic atherosclerotic plaque noted. Reproductive: No mass or other significant abnormality. Other: No evidence of inguinal lymphadenopathy, mass, or hernia. Bowel Musculoskeletal:  No suspicious bone lesions identified. IMPRESSION: No evidence of lymphadenopathy or other masses within the chest, abdomen, or pelvis. Mild hepatic steatosis. Cholelithiasis. No radiographic evidence of cholecystitis. Electronically Signed   By: Myles RosenthalJohn  Stahl M.D.   On: 02/27/2016 17:41    ASSESSMENT: Lymphadenopathy.  PLAN:    1. Lymphadenopathy: CT scan results from February 27, 2016 reviewed independently and reported as above with no evidence of lymphadenopathy  or other suspicious lesions. All of his blood work other  than a mild leukocytosis is either negative or within normal limits. Peripheral blood flow cytometry is also negative.  No further follow-up is necessary. 2. Pain: Unclear etiology, monitor. Possibly musculoskeletal in nature. 3. Leukocytosis: Likely reactive. Monitor. 4. Hypertension: Have recommended patient obtain a primary care physician for further evaluation.  Patient expressed understanding and was in agreement with this plan. He also understands that He can call clinic at any time with any questions, concerns, or complaints.    Jeralyn Ruths, MD   03/07/2016 9:40 AM

## 2016-03-07 NOTE — Progress Notes (Signed)
[facility]  Telephone:(336) [phone] Fax:(336) [phone]  ID: Jordan [name] OB: 1971/07/14  MR#: [id number]  AOZ#:[id number]#:[id number]  Patient Care Team: Bobby RumpfKhary Carew, MD as PCP - General (Family Medicine)  CHIEF COMPLAINT:  Chief Complaint  Patient presents with  . New Evaluation    Axillary adenopathy/Inguinal lymphadenopathy     INTERVAL HISTORY: Patient is a 45 year old male who is referred for emergent care after noted to have axillary and inguinal lymphadenopathy. Patient states he is having 10 out of 10 pain in those areas. He otherwise feels well. He denies any fevers, night sweats, or weight loss. He has no neurologic complaints. He denies any chest pain or shortness of breath. He denies any nausea, vomiting, constipation, or diarrhea. He has no urinary complaints. Patient otherwise feels well and offers no further specific complaints.  REVIEW OF SYSTEMS:   Review of Systems  Constitutional: Negative for fever, weight loss, malaise/fatigue and diaphoresis.  Respiratory: Negative.   Cardiovascular: Negative.   Gastrointestinal: Positive for blood in stool. Negative for melena.  Genitourinary: Negative.   Musculoskeletal: Negative.   Neurological: Negative.  Negative for weakness.  Endo/Heme/Allergies: Does not bruise/bleed easily.    As per HPI. Otherwise, a complete review of systems is negatve.  PAST MEDICAL HISTORY: Past Medical History  Diagnosis Date  . Coronary disease   . Hypertension   . Angina pectoris (HCC)   . Smoking   . Hypercholesterolemia   . Myocardial infarction [facility](HCC)     PAST SURGICAL HISTORY: Past Surgical History  Procedure Laterality Date  . Pci and stent x 2 with des      FAMILY HISTORY: Reviewed and unchanged. No reported history of malignancy or chronic disease.     ADVANCED DIRECTIVES:    HEALTH MAINTENANCE: Social History  Substance Use Topics  . Smoking status: Current Every Day Smoker -- 1.00 packs/day for 20 years      Types: Cigarettes  . Smokeless tobacco: Not on file  . Alcohol Use: 0.0 oz/week    0 Standard drinks or equivalent per week     Colonoscopy:  PAP:  Bone density:  Lipid panel:  No Known Allergies  Current Outpatient Prescriptions  Medication Sig Dispense Refill  . aspirin EC 81 MG tablet Take by mouth.    . clopidogrel (PLAVIX) 75 MG tablet     . simvastatin (ZOCOR) 10 MG tablet Take by mouth.     No current facility-administered medications for this visit.    OBJECTIVE: Filed Vitals:   02/22/16 1203  BP: 183/99  Pulse: 87  Temp: 98.3 F (36.8 C)  Resp: 18     Body mass index is 29.86 kg/(m^2).    ECOG FS:0 - Asymptomatic  General: Well-developed, well-nourished, no acute distress. Eyes: Pink conjunctiva, anicteric sclera. HEENT: Normocephalic, moist mucous membranes, clear oropharnyx. Lungs: Clear to auscultation bilaterally. Heart: Regular rate and rhythm. No rubs, murmurs, or gallops. Abdomen: Soft, nontender, nondistended. No organomegaly noted, normoactive bowel sounds. Musculoskeletal: No edema, cyanosis, or clubbing. Neuro: Alert, answering all questions appropriately. Cranial nerves grossly intact. Skin: No rashes or petechiae noted. Psych: Normal affect. Lymphatics: Minimally palpable axillary and inguinal lymphadenopathy.  LAB RESULTS:  Lab Results  Component Value Date   NA 133* 02/22/2016   K 3.7 02/22/2016   CL 101 02/22/2016   CO2 24 02/22/2016   GLUCOSE 103* 02/22/2016   BUN 11 02/22/2016   CREATININE 0.75 02/22/2016   CALCIUM 8.8* 02/22/2016   PROT 7.8 02/22/2016   ALBUMIN 4.4 02/22/2016  AST 30 02/22/2016   ALT 37 02/22/2016   ALKPHOS 85 02/22/2016   BILITOT 0.6 02/22/2016   GFRNONAA >60 02/22/2016   GFRAA >60 02/22/2016    Lab Results  Component Value Date   WBC 10.9* 02/22/2016   NEUTROABS 8.1* 02/22/2016   HGB 17.1 02/22/2016   HCT 48.7 02/22/2016   MCV 85.4 02/22/2016   PLT 257 02/22/2016     STUDIES: Ct Chest W  Contrast  02/27/2016  CLINICAL DATA:  Pain, swelling and mass in right pelvis and left axilla for several weeks. Fatigue. EXAM: CT CHEST, ABDOMEN, AND PELVIS WITH CONTRAST TECHNIQUE: Multidetector CT imaging of the chest, abdomen and pelvis was performed following the standard protocol during bolus administration of intravenous contrast. CONTRAST:  100 mL Isovue 370 COMPARISON:  Chest CT on 12/09/2012 FINDINGS: CT CHEST FINDINGS Mediastinum/Lymph Nodes: No masses, pathologically enlarged lymph nodes, or other significant abnormality. No axillary lymphadenopathy identified. No other chest wall mass demonstrated. Heart size is normal. Coronary artery calcification noted. Lungs/Pleura: No pulmonary mass, infiltrate, or effusion. Musculoskeletal: No chest wall mass or suspicious bone lesions identified. CT ABDOMEN PELVIS FINDINGS Hepatobiliary: Mild hepatic steatosis noted. No liver masses identified. Tiny calcified gallstones seen, however there is no evidence of cholecystitis or biliary ductal dilatation. Pancreas: No mass, inflammatory changes, or other significant abnormality. Spleen: Within normal limits in size and appearance. Adrenals/Urinary Tract: No masses identified. No evidence of hydronephrosis. Stomach/Bowel: No evidence of obstruction, inflammatory process, or abnormal fluid collections. Normal appendix visualized. Vascular/Lymphatic: No pathologically enlarged lymph nodes. No evidence of abdominal aortic aneurysm. Aortic atherosclerotic plaque noted. Reproductive: No mass or other significant abnormality. Other: No evidence of inguinal lymphadenopathy, mass, or hernia. Bowel Musculoskeletal:  No suspicious bone lesions identified. IMPRESSION: No evidence of lymphadenopathy or other masses within the chest, abdomen, or pelvis. Mild hepatic steatosis. Cholelithiasis. No radiographic evidence of cholecystitis. Electronically Signed   By: Myles Rosenthal M.D.   On: 02/27/2016 17:41   Ct Abdomen Pelvis W  Contrast  02/27/2016  CLINICAL DATA:  Pain, swelling and mass in right pelvis and left axilla for several weeks. Fatigue. EXAM: CT CHEST, ABDOMEN, AND PELVIS WITH CONTRAST TECHNIQUE: Multidetector CT imaging of the chest, abdomen and pelvis was performed following the standard protocol during bolus administration of intravenous contrast. CONTRAST:  100 mL Isovue 370 COMPARISON:  Chest CT on 12/09/2012 FINDINGS: CT CHEST FINDINGS Mediastinum/Lymph Nodes: No masses, pathologically enlarged lymph nodes, or other significant abnormality. No axillary lymphadenopathy identified. No other chest wall mass demonstrated. Heart size is normal. Coronary artery calcification noted. Lungs/Pleura: No pulmonary mass, infiltrate, or effusion. Musculoskeletal: No chest wall mass or suspicious bone lesions identified. CT ABDOMEN PELVIS FINDINGS Hepatobiliary: Mild hepatic steatosis noted. No liver masses identified. Tiny calcified gallstones seen, however there is no evidence of cholecystitis or biliary ductal dilatation. Pancreas: No mass, inflammatory changes, or other significant abnormality. Spleen: Within normal limits in size and appearance. Adrenals/Urinary Tract: No masses identified. No evidence of hydronephrosis. Stomach/Bowel: No evidence of obstruction, inflammatory process, or abnormal fluid collections. Normal appendix visualized. Vascular/Lymphatic: No pathologically enlarged lymph nodes. No evidence of abdominal aortic aneurysm. Aortic atherosclerotic plaque noted. Reproductive: No mass or other significant abnormality. Other: No evidence of inguinal lymphadenopathy, mass, or hernia. Bowel Musculoskeletal:  No suspicious bone lesions identified. IMPRESSION: No evidence of lymphadenopathy or other masses within the chest, abdomen, or pelvis. Mild hepatic steatosis. Cholelithiasis. No radiographic evidence of cholecystitis. Electronically Signed   By: Myles Rosenthal M.D.   On: 02/27/2016  17:41    ASSESSMENT:  Lymphadenopathy.  PLAN:    1. Lymphadenopathy: CT scan results from February 27, 2016 reviewed independently and reported as above with no evidence of lymphadenopathy or other suspicious lesions. All of his blood work other than a mild leukocytosis is either negative or within normal limits. Peripheral blood flow cytometry is also negative. Patient will return to clinic in 2 weeks to discuss the results. 2. Pain: Unclear etiology, monitor. 3. Leukocytosis: Likely reactive. Monitor. 4. Hypertension: Have recommended patient obtain a primary care physician for further evaluation.  Patient expressed understanding and was in agreement with this plan. He also understands that He can call clinic at any time with any questions, concerns, or complaints.    Jeralyn Ruths, MD   03/07/2016 9:37 AM

## 2017-04-13 ENCOUNTER — Emergency Department: Payer: PRIVATE HEALTH INSURANCE

## 2017-04-13 ENCOUNTER — Emergency Department
Admission: EM | Admit: 2017-04-13 | Discharge: 2017-04-13 | Disposition: A | Discharge status: Home / Self Care | Payer: PRIVATE HEALTH INSURANCE | Attending: Emergency Medicine | Admitting: Emergency Medicine

## 2017-04-13 DIAGNOSIS — Y998 Other external cause status: Secondary | ICD-10-CM | POA: Diagnosis not present

## 2017-04-13 DIAGNOSIS — Y92009 Unspecified place in unspecified non-institutional (private) residence as the place of occurrence of the external cause: Secondary | ICD-10-CM | POA: Diagnosis not present

## 2017-04-13 DIAGNOSIS — I252 Old myocardial infarction: Secondary | ICD-10-CM | POA: Insufficient documentation

## 2017-04-13 DIAGNOSIS — Z7982 Long term (current) use of aspirin: Secondary | ICD-10-CM | POA: Diagnosis not present

## 2017-04-13 DIAGNOSIS — S0990XA Unspecified injury of head, initial encounter: Secondary | ICD-10-CM | POA: Diagnosis present

## 2017-04-13 DIAGNOSIS — Y93G9 Activity, other involving cooking and grilling: Secondary | ICD-10-CM | POA: Insufficient documentation

## 2017-04-13 DIAGNOSIS — F17210 Nicotine dependence, cigarettes, uncomplicated: Secondary | ICD-10-CM | POA: Insufficient documentation

## 2017-04-13 DIAGNOSIS — I2510 Atherosclerotic heart disease of native coronary artery without angina pectoris: Secondary | ICD-10-CM | POA: Insufficient documentation

## 2017-04-13 DIAGNOSIS — Z79899 Other long term (current) drug therapy: Secondary | ICD-10-CM | POA: Insufficient documentation

## 2017-04-13 DIAGNOSIS — F10229 Alcohol dependence with intoxication, unspecified: Secondary | ICD-10-CM | POA: Diagnosis not present

## 2017-04-13 DIAGNOSIS — Z23 Encounter for immunization: Secondary | ICD-10-CM | POA: Insufficient documentation

## 2017-04-13 DIAGNOSIS — W01198A Fall on same level from slipping, tripping and stumbling with subsequent striking against other object, initial encounter: Secondary | ICD-10-CM | POA: Insufficient documentation

## 2017-04-13 DIAGNOSIS — F10920 Alcohol use, unspecified with intoxication, uncomplicated: Secondary | ICD-10-CM

## 2017-04-13 DIAGNOSIS — Z7902 Long term (current) use of antithrombotics/antiplatelets: Secondary | ICD-10-CM | POA: Insufficient documentation

## 2017-04-13 DIAGNOSIS — I10 Essential (primary) hypertension: Secondary | ICD-10-CM | POA: Diagnosis not present

## 2017-04-13 LAB — ETHANOL: ALCOHOL ETHYL (B): 254 mg/dL — AB (ref <5)

## 2017-04-13 LAB — URINALYSIS, COMPLETE (UACMP) WITH MICROSCOPIC
Bacteria, UA: NONE SEEN
Bilirubin Urine: NEGATIVE
GLUCOSE, UA: NEGATIVE mg/dL
KETONES UR: NEGATIVE mg/dL
LEUKOCYTES UA: NEGATIVE
Nitrite: NEGATIVE
PROTEIN: NEGATIVE mg/dL
RBC / HPF: NONE SEEN RBC/hpf (ref 0–5)
SQUAMOUS EPITHELIAL / LPF: NONE SEEN
Specific Gravity, Urine: 1.001 — ABNORMAL LOW (ref 1.005–1.030)
WBC UA: NONE SEEN WBC/hpf (ref 0–5)
pH: 6 (ref 5.0–8.0)

## 2017-04-13 LAB — URINE DRUG SCREEN, QUALITATIVE (ARMC ONLY)
AMPHETAMINES, UR SCREEN: NOT DETECTED
BENZODIAZEPINE, UR SCRN: NOT DETECTED
Barbiturates, Ur Screen: NOT DETECTED
COCAINE METABOLITE, UR ~~LOC~~: NOT DETECTED
Cannabinoid 50 Ng, Ur ~~LOC~~: POSITIVE — AB
MDMA (ECSTASY) UR SCREEN: NOT DETECTED
Methadone Scn, Ur: NOT DETECTED
OPIATE, UR SCREEN: NOT DETECTED
PHENCYCLIDINE (PCP) UR S: NOT DETECTED
Tricyclic, Ur Screen: NOT DETECTED

## 2017-04-13 LAB — COMPREHENSIVE METABOLIC PANEL
ALT: 27 U/L (ref 17–63)
ANION GAP: 10 (ref 5–15)
AST: 25 U/L (ref 15–41)
Albumin: 4.4 g/dL (ref 3.5–5.0)
Alkaline Phosphatase: 72 U/L (ref 38–126)
BUN: 11 mg/dL (ref 6–20)
CHLORIDE: 104 mmol/L (ref 101–111)
CO2: 24 mmol/L (ref 22–32)
CREATININE: 0.93 mg/dL (ref 0.61–1.24)
Calcium: 9.2 mg/dL (ref 8.9–10.3)
Glucose, Bld: 105 mg/dL — ABNORMAL HIGH (ref 65–99)
POTASSIUM: 3.6 mmol/L (ref 3.5–5.1)
SODIUM: 138 mmol/L (ref 135–145)
Total Bilirubin: 0.5 mg/dL (ref 0.3–1.2)
Total Protein: 8 g/dL (ref 6.5–8.1)

## 2017-04-13 LAB — CBC
HEMATOCRIT: 46.5 % (ref 40.0–52.0)
HEMOGLOBIN: 15.2 g/dL (ref 13.0–18.0)
MCH: 25.9 pg — ABNORMAL LOW (ref 26.0–34.0)
MCHC: 32.7 g/dL (ref 32.0–36.0)
MCV: 79.2 fL — AB (ref 80.0–100.0)
Platelets: 285 10*3/uL (ref 150–440)
RBC: 5.87 MIL/uL (ref 4.40–5.90)
RDW: 15.8 % — AB (ref 11.5–14.5)
WBC: 11.6 10*3/uL — AB (ref 3.8–10.6)

## 2017-04-13 LAB — LIPASE, BLOOD: LIPASE: 31 U/L (ref 11–51)

## 2017-04-13 LAB — TROPONIN I: Troponin I: <0.03 ng/mL (ref <0.03)

## 2017-04-13 MED ORDER — TETANUS-DIPHTH-ACELL PERTUSSIS 5-2.5-18.5 LF-MCG/0.5 IM SUSP
0.5000 mL | Freq: Once | INTRAMUSCULAR | Status: AC
  Administered 2017-04-13: 0.5 mL via INTRAMUSCULAR
  Filled 2017-04-13: qty 0.5

## 2017-04-13 MED ORDER — SODIUM CHLORIDE 0.9 % IV BOLUS (SEPSIS)
1000.0000 mL | Freq: Once | INTRAVENOUS | Status: AC
  Administered 2017-04-13: 1000 mL via INTRAVENOUS

## 2017-04-13 NOTE — ED Triage Notes (Signed)
Pt presents to ED via [facility] from home. Pt appears VERY intoxicated. Pt was in yard and fel and hit a pole with L side of head. Pt does not complain of any pain. Except when Dr. Pershing ProudSchaevitz presses on belly. Pt stating he needs to urinate. Pt appears confused. Pt doesn't remember hitting head. Pt states he is confused d/t alcohol. Pt drank beer, liquor and moonshine. Pt has hx of HTN and stent placement. HTN with EMS. CBG 92. NKA.

## 2017-04-13 NOTE — ED Provider Notes (Signed)
[facility] Emergency Department Provider Note  ____________________________________________   First MD Initiated Contact with Patient 04/13/17 1942     (approximate)  I have reviewed the triage vital signs and the nursing notes.   HISTORY  Chief Complaint Fall and Alcohol Intoxication   HPI Jordan [name] is a 46 y.o. male with history of angina as well as alcoholism who is presenting to the emergency department today after falling into a wooden post. He is accompanied by family who say that he has been drinking all day. He started with beer and then switched to liquor. 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 There was a 20 to 32nd loss of consciousness and the patient was disoriented for sometime after that. The patient says that he drinks 4-5 beers a day during the week on the weekends increases the amount that he drinks. He is denying any pain at this time. Does not know the last date of his tetanus shot.   Past Medical History:  Diagnosis Date  . Angina pectoris (HCC)   . Coronary disease   . Hypercholesterolemia   . Hypertension   . Myocardial infarction (HCC)   . Smoking     There are no active problems to display for this patient.   Past Surgical History:  Procedure Laterality Date  . PCI and stent x 2 with DES      Prior to Admission medications   Medication Sig Start Date End Date Taking? Authorizing Provider  aspirin EC 81 MG tablet Take by mouth.    [provider]  clopidogrel (PLAVIX) 75 MG tablet  10/10/15   [provider]  simvastatin (ZOCOR) 10 MG tablet Take by mouth.    [provider]    Allergies Patient has no known allergies.  History reviewed. No pertinent family history.  Social History Social History  Substance Use Topics  . Smoking status: Current Every Day Smoker    Packs/day: 1.00    Years: 20.00    Types: Cigarettes  .  Smokeless tobacco: Not on file  . Alcohol use 0.0 oz/week     Comment: beer, moonshine    Review of Systems  Constitutional: No fever/chills Eyes: No visual changes. ENT: No sore throat. Cardiovascular: Denies chest pain. Respiratory: Denies shortness of breath. Gastrointestinal: No abdominal pain.  No nausea, no vomiting.  No diarrhea.  No constipation. Genitourinary: Negative for dysuria. Musculoskeletal: Negative for back pain. Skin: Negative for rash. Neurological: Negative for headaches, focal weakness or numbness.   ____________________________________________   PHYSICAL EXAM:  VITAL SIGNS: ED Triage Vitals [04/13/17 1942]  Enc Vitals Group     BP (!) 167/103     Pulse Rate (!) 107     Resp 17     Temp 97.9 F (36.6 C)     Temp Source Oral     SpO2 97 %     Weight      Height      Head Circumference      Peak Flow      Pain Score 0     Pain Loc      Pain Edu?      Excl. in GC?     Constitutional: Alert and oriented.  in no acute distress.Patient squinting. Mildly slurred speech consistent with intoxication. Eyes: Conjunctivae are normal. PERRL. EOMI. Head: Small abrasion, about 2 cm x 3 mm to the left parietal region. There is no  active bleeding. No depression. No tenderness to palpation.  Nose: No congestion/rhinnorhea. Mouth/Throat: Mucous membranes are moist.   Neck: No stridor.  Ranges that neck freely. No tenderness palpation of the cervical spine. No deformity or step-off. Cardiovascular: Tachycardic, regular rhythm. Grossly normal heart sounds.   Respiratory: Normal respiratory effort.  No retractions. Lungs CTAB. Gastrointestinal: Soft and with mild diffuse tenderness to palpation. However, the patient is to urinate.  Musculoskeletal: No lower extremity tenderness nor edema.  No joint effusions. Neurologic:  Normal language. No gross focal neurologic deficits are appreciated.  Skin:  Skin is warm, dry and intact. No rash noted. Psychiatric: Mood  and affect are normal.   ____________________________________________   LABS (all labs ordered are listed, but only abnormal results are displayed)  Labs Reviewed  COMPREHENSIVE METABOLIC PANEL - Abnormal; Notable for the following:       Result Value   Glucose, Bld 105 (*)    All other components within normal limits  ETHANOL - Abnormal; Notable for the following:    Alcohol, Ethyl (B) 254 (*)    All other components within normal limits  CBC - Abnormal; Notable for the following:    WBC 11.6 (*)    MCV 79.2 (*)    MCH 25.9 (*)    RDW 15.8 (*)    All other components within normal limits  URINE DRUG SCREEN, QUALITATIVE ([facility] ONLY) - Abnormal; Notable for the following:    Cannabinoid 50 Ng, Ur [place] POSITIVE (*)    All other components within normal limits  URINALYSIS, COMPLETE (UACMP) WITH MICROSCOPIC - Abnormal; Notable for the following:    Color, Urine COLORLESS (*)    APPearance CLEAR (*)    Specific Gravity, Urine 1.001 (*)    Hgb urine dipstick SMALL (*)    All other components within normal limits  LIPASE, BLOOD  TROPONIN I   ____________________________________________  EKG  ED ECG REPORT I, Arelia Longest, the attending physician, personally viewed and interpreted this ECG.   Date: 04/13/2017  EKG Time: 1942  Rate: 107  Rhythm: sinus tachycardia  Axis: normal  Intervals:none  ST&T Change: No ST segment elevation or depression. No abnormal T-wave inversion. Poor baseline likely related to patient moving while cardiogram was being performed.  ED ECG REPORT I, Arelia Longest, the attending physician, personally viewed and interpreted this ECG.   Date: 04/13/2017  EKG Time: 2005  Rate: 101  Rhythm: sinus tachycardia  Axis: Normal  Intervals:none  ST&T Change: Single T-wave inversion in aVL. Otherwise, there is no ST elevation or depression. No abnormal T-wave inversion.  No significant Change from EKG done on  09/16/2011. ____________________________________________  RADIOLOGY  CT Head Wo Contrast (Final result)  Result time 04/13/17 20:38:51  Final result by Charline Bills, MD (04/13/17 20:38:51)           Narrative:   CLINICAL DATA: Fall, left parietal abrasion  EXAM: CT HEAD WITHOUT CONTRAST  TECHNIQUE: Contiguous axial images were obtained from the base of the skull through the vertex without intravenous contrast.  COMPARISON: CT orbits dated 03/02/2010  FINDINGS: Brain: No evidence of acute infarction, hemorrhage, hydrocephalus, extra-axial collection or mass lesion/mass effect.  Vascular: No hyperdense vessel or unexpected calcification.  Skull: Normal. Negative for fracture or focal lesion.  Sinuses/Orbits: The visualized paranasal sinuses are essentially clear. The mastoid air cells are unopacified.  Other: None.  IMPRESSION: Normal head CT.   Electronically Signed By: Charline Bills M.D. On: 04/13/2017 20:38  ____________________________________________   PROCEDURES  Procedure(s) performed:   Procedures  Critical Care performed:   ____________________________________________   INITIAL IMPRESSION / ASSESSMENT AND PLAN / ED COURSE  Pertinent labs & imaging results that were available during my care of the patient were reviewed by me and considered in my medical decision making (see chart for details).  ----------------------------------------- 8:08 PM on 04/13/2017 -----------------------------------------  Family came out of the room and said that the patient had yawned and then passed out for several seconds. The patient then awoke and his back to the same status of intoxication/mental status that he has presented with. Patient with history of coronary artery disease. We'll add a troponin. EKG is reassuring at this time except for mild tachycardia which is likely secondary to the patient's intoxication.     ----------------------------------------- 10:31 PM on 04/13/2017 -----------------------------------------  Patient at this time is not slurring his speech. He is able to ambulate without assistance with a normal gait. He has clinically sober at this time. He says that he no longer feels intoxicated. He is here with family who say they will be able to keep an eye on him supervise him for the remainder of the evening. The patient will be discharged home.      ____________________________________________   FINAL CLINICAL IMPRESSION(S) / ED DIAGNOSES  Alcohol intoxication. Fall. Head injury.    NEW MEDICATIONS STARTED DURING THIS VISIT:  New Prescriptions   No medications on file     Note:  This document was prepared using Dragon voice recognition software and may include unintentional dictation errors.    Myrna Blazer, MD 04/13/17 [phone]

## 2017-05-26 ENCOUNTER — Emergency Department: Payer: PRIVATE HEALTH INSURANCE

## 2017-05-26 ENCOUNTER — Encounter: Payer: Self-pay | Admitting: Emergency Medicine

## 2017-05-26 ENCOUNTER — Observation Stay
Admission: EM | Admit: 2017-05-26 | Discharge: 2017-05-26 | Disposition: A | Discharge status: Home / Self Care | Payer: PRIVATE HEALTH INSURANCE | Attending: Internal Medicine | Admitting: Internal Medicine

## 2017-05-26 DIAGNOSIS — I252 Old myocardial infarction: Secondary | ICD-10-CM | POA: Insufficient documentation

## 2017-05-26 DIAGNOSIS — Z79899 Other long term (current) drug therapy: Secondary | ICD-10-CM | POA: Insufficient documentation

## 2017-05-26 DIAGNOSIS — Z955 Presence of coronary angioplasty implant and graft: Secondary | ICD-10-CM | POA: Diagnosis not present

## 2017-05-26 DIAGNOSIS — R0789 Other chest pain: Secondary | ICD-10-CM | POA: Diagnosis present

## 2017-05-26 DIAGNOSIS — D649 Anemia, unspecified: Secondary | ICD-10-CM | POA: Diagnosis not present

## 2017-05-26 DIAGNOSIS — I10 Essential (primary) hypertension: Secondary | ICD-10-CM | POA: Diagnosis not present

## 2017-05-26 DIAGNOSIS — F17210 Nicotine dependence, cigarettes, uncomplicated: Secondary | ICD-10-CM | POA: Insufficient documentation

## 2017-05-26 DIAGNOSIS — Z7902 Long term (current) use of antithrombotics/antiplatelets: Secondary | ICD-10-CM | POA: Insufficient documentation

## 2017-05-26 DIAGNOSIS — Z7982 Long term (current) use of aspirin: Secondary | ICD-10-CM | POA: Diagnosis not present

## 2017-05-26 DIAGNOSIS — R55 Syncope and collapse: Secondary | ICD-10-CM | POA: Insufficient documentation

## 2017-05-26 DIAGNOSIS — I259 Chronic ischemic heart disease, unspecified: Secondary | ICD-10-CM | POA: Diagnosis not present

## 2017-05-26 DIAGNOSIS — I200 Unstable angina: Secondary | ICD-10-CM | POA: Diagnosis present

## 2017-05-26 DIAGNOSIS — E7800 Pure hypercholesterolemia, unspecified: Secondary | ICD-10-CM | POA: Diagnosis not present

## 2017-05-26 LAB — BASIC METABOLIC PANEL
Anion gap: 7 (ref 5–15)
BUN: 12 mg/dL (ref 6–20)
CALCIUM: 8.9 mg/dL (ref 8.9–10.3)
CO2: 24 mmol/L (ref 22–32)
CREATININE: 0.86 mg/dL (ref 0.61–1.24)
Chloride: 104 mmol/L (ref 101–111)
GFR calc non Af Amer: >60 mL/min (ref >60)
Glucose, Bld: 153 mg/dL — ABNORMAL HIGH (ref 65–99)
Potassium: 3.6 mmol/L (ref 3.5–5.1)
SODIUM: 135 mmol/L (ref 135–145)

## 2017-05-26 LAB — TROPONIN I

## 2017-05-26 LAB — HEPATIC FUNCTION PANEL
ALBUMIN: 3.9 g/dL (ref 3.5–5.0)
ALT: 16 U/L — ABNORMAL LOW (ref 17–63)
AST: 22 U/L (ref 15–41)
Alkaline Phosphatase: 76 U/L (ref 38–126)
BILIRUBIN TOTAL: 0.6 mg/dL (ref 0.3–1.2)
Bilirubin, Direct: <0.1 mg/dL — ABNORMAL LOW (ref 0.1–0.5)
TOTAL PROTEIN: 7.3 g/dL (ref 6.5–8.1)

## 2017-05-26 LAB — CBC WITH DIFFERENTIAL/PLATELET
BASOS ABS: 0.2 10*3/uL — AB (ref 0–0.1)
BASOS PCT: 2 %
EOS ABS: 0.3 10*3/uL (ref 0–0.7)
Eosinophils Relative: 3 %
HEMATOCRIT: 26.5 % — AB (ref 40.0–52.0)
Hemoglobin: 8.6 g/dL — ABNORMAL LOW (ref 13.0–18.0)
Lymphocytes Relative: 16 %
Lymphs Abs: 1.6 10*3/uL (ref 1.0–3.6)
MCH: 24.2 pg — ABNORMAL LOW (ref 26.0–34.0)
MCHC: 32.4 g/dL (ref 32.0–36.0)
MCV: 74.6 fL — ABNORMAL LOW (ref 80.0–100.0)
MONO ABS: 0.8 10*3/uL (ref 0.2–1.0)
MONOS PCT: 8 %
NEUTROS ABS: 7.2 10*3/uL — AB (ref 1.4–6.5)
Neutrophils Relative %: 71 %
PLATELETS: 349 10*3/uL (ref 150–440)
RBC: 3.55 MIL/uL — ABNORMAL LOW (ref 4.40–5.90)
RDW: 18.8 % — AB (ref 11.5–14.5)
WBC: 10.1 10*3/uL (ref 3.8–10.6)

## 2017-05-26 LAB — BRAIN NATRIURETIC PEPTIDE: B NATRIURETIC PEPTIDE 5: 24 pg/mL (ref 0.0–100.0)

## 2017-05-26 LAB — GLUCOSE, CAPILLARY: GLUCOSE-CAPILLARY: 167 mg/dL — AB (ref 65–99)

## 2017-05-26 MED ORDER — LORAZEPAM 2 MG/ML IJ SOLN: 0.0000 mg | Freq: Two times a day (BID) | INTRAMUSCULAR | Status: DC

## 2017-05-26 MED ORDER — NITROGLYCERIN 0.4 MG SL SUBL
0.4000 mg | SUBLINGUAL_TABLET | SUBLINGUAL | Status: DC | PRN
  Administered 2017-05-26 (×3): 0.4 mg via SUBLINGUAL
  Filled 2017-05-26: qty 1

## 2017-05-26 MED ORDER — ADULT MULTIVITAMIN W/MINERALS CH: 1.0000 | ORAL_TABLET | Freq: Every day | ORAL | Status: DC

## 2017-05-26 MED ORDER — THIAMINE HCL 100 MG/ML IJ SOLN: 100.0000 mg | Freq: Every day | INTRAMUSCULAR | Status: DC

## 2017-05-26 MED ORDER — FOLIC ACID 1 MG PO TABS: 1.0000 mg | ORAL_TABLET | Freq: Every day | ORAL | Status: DC

## 2017-05-26 MED ORDER — LORAZEPAM 2 MG/ML IJ SOLN
0.0000 mg | Freq: Four times a day (QID) | INTRAMUSCULAR | Status: DC
Start: 2017-05-26 — End: 2017-05-26

## 2017-05-26 MED ORDER — VITAMIN B-1 100 MG PO TABS: 100.0000 mg | ORAL_TABLET | Freq: Every day | ORAL | Status: DC

## 2017-05-26 MED ORDER — ASPIRIN 81 MG PO CHEW
162.0000 mg | CHEWABLE_TABLET | Freq: Once | ORAL | Status: AC
  Administered 2017-05-26: 162 mg via ORAL
  Filled 2017-05-26: qty 2

## 2017-05-26 MED ORDER — LORAZEPAM 2 MG/ML IJ SOLN: 1.0000 mg | Freq: Four times a day (QID) | INTRAMUSCULAR | Status: DC | PRN

## 2017-05-26 MED ORDER — LORAZEPAM 1 MG PO TABS: 1.0000 mg | ORAL_TABLET | Freq: Four times a day (QID) | ORAL | Status: DC | PRN

## 2017-05-26 NOTE — ED Notes (Addendum)
This RN to bedside at this time with Dr. Lamont Snowballifenbark and Fredricka Bonineonnor, Extern. Dr. Lamont Snowballifenbark explained to patient the dangers of leaving AMA and that patient was allowed to come back at any time. Pt states understanding and that he wants to leave but is agreeable to coming back should his 2nd troponin come back positive. This RN explained will come back to draw 2nd troponin. Apologized for delay in coming back to bedside.

## 2017-05-26 NOTE — ED Notes (Signed)
Pt assisted out of vehicle and placed in wheelchair then taken straight back to room 5. C/o chest pain, pt pale and diaphoretic. Hx of MI.

## 2017-05-26 NOTE — ED Provider Notes (Signed)
[facility] Emergency Department Provider Note  ____________________________________________   First MD Initiated Contact with Patient 05/26/17 [phone]     (approximate)  I have reviewed the triage vital signs and the nursing notes.   HISTORY  Chief Complaint Chest Pain   HPI Brien FewRoy Sisk is a 46 y.o. male who comes to the emergency department with sudden onset crushing left substernal chest pain associated with diaphoresis and nausea and shortness of breath that began this morning when he was doing work.He has a previous history of myocardial infarction and has multiple stents. He reports compliance with his Plavix and aspirin, however has not seen his cardiologist in multiple years. Today apparently while at work he became diaphoretic complained of chest pain and had a syncopal episode. Nothing in particular seems to make his pain better or worse.   Past Medical History:  Diagnosis Date  . Angina pectoris (HCC)   . Coronary disease   . Hypercholesterolemia   . Hypertension   . Myocardial infarction (HCC)   . Smoking     Patient Active Problem List   Diagnosis Date Noted  . Unstable angina (HCC) 05/26/2017    Past Surgical History:  Procedure Laterality Date  . PCI and stent x 2 with DES      Prior to Admission medications   Medication Sig Start Date End Date Taking? Authorizing Provider  aspirin EC 81 MG tablet Take by mouth.   Yes [provider]  clopidogrel (PLAVIX) 75 MG tablet Take 75 mg by mouth daily.  10/10/15  Yes [provider]  simvastatin (ZOCOR) 40 MG tablet Take 40 mg by mouth.    Yes [provider]    Allergies Patient has no known allergies.  History reviewed. No pertinent family history.  Social History Social History  Substance Use Topics  . Smoking status: Current Every Day Smoker    Packs/day: 1.00    Years: 20.00    Types: Cigarettes  . Smokeless tobacco: Never Used  . Alcohol use 0.0  oz/week     Comment: beer, moonshine    Review of Systems Constitutional: No fever/chills Eyes: No visual changes. ENT: No sore throat. Cardiovascular: Positive chest pain. Respiratory: Positive shortness of breath. Gastrointestinal: No abdominal pain.  Positive nausea, no vomiting.  No diarrhea.  No constipation. Genitourinary: Negative for dysuria. Musculoskeletal: Negative for back pain. Skin: Negative for rash. Neurological: Negative for headaches, focal weakness or numbness.   ____________________________________________   PHYSICAL EXAM:  VITAL SIGNS: ED Triage Vitals  Enc Vitals Group     BP      Pulse      Resp      Temp      Temp src      SpO2      Weight      Height      Head Circumference      Peak Flow      Pain Score      Pain Loc      Pain Edu?      Excl. in GC?     Constitutional: Alert and oriented 4 appears uncomfortable clutching his chest with mild diaphoresis Eyes: PERRL EOMI. Head: Atraumatic. Nose: No congestion/rhinnorhea. Mouth/Throat: No trismus Neck: No stridor.   Cardiovascular: Normal rate, regular rhythm. Grossly normal heart sounds.  Good peripheral circulation. Respiratory: Normal respiratory effort.  No retractions. Lungs CTAB and moving good air Gastrointestinal: Soft nontender Musculoskeletal: No lower extremity edema   Neurologic:  Normal  speech and language. No gross focal neurologic deficits are appreciated. Skin:  Mild diaphoresis Psychiatric: Mood and affect are normal. Speech and behavior are normal.    ____________________________________________   DIFFERENTIAL includes but not limited to  STEMI, NSTEMI, unstable angina, stable angina, pulmonary embolism, pneumothorax, Boerhaave syndrome, aortic dissection _________________   LABS (all labs ordered are listed, but only abnormal results are displayed)  Labs Reviewed  BASIC METABOLIC PANEL - Abnormal; Notable for the following:       Result Value   Glucose,  Bld 153 (*)    All other components within normal limits  HEPATIC FUNCTION PANEL - Abnormal; Notable for the following:    ALT 16 (*)    Bilirubin, Direct <0.1 (*)    All other components within normal limits  CBC WITH DIFFERENTIAL/PLATELET - Abnormal; Notable for the following:    RBC 3.55 (*)    Hemoglobin 8.6 (*)    HCT 26.5 (*)    MCV 74.6 (*)    MCH 24.2 (*)    RDW 18.8 (*)    Neutro Abs 7.2 (*)    Basophils Absolute 0.2 (*)    All other components within normal limits  GLUCOSE, CAPILLARY - Abnormal; Notable for the following:    Glucose-Capillary 167 (*)    All other components within normal limits  BRAIN NATRIURETIC PEPTIDE  TROPONIN I  TROPONIN I    No signs of acute ischemia on first troponin __________________________________________  EKG  ED ECG REPORT I, Merrily Brittle, the attending physician, personally viewed and interpreted this ECG.  Date: 05/26/2017 Rate: 87 Rhythm: normal sinus rhythm QRS Axis: normal Intervals: normal ST/T Wave abnormalities: normal Narrative Interpretation: unremarkable  ____________________________________________  RADIOLOGY  Chest x-ray with no acute disease ____________________________________________   PROCEDURES  Procedure(s) performed: no  Procedures  Critical Care performed: yes  CRITICAL CARE Performed by: Merrily Brittle   Total critical care time: 35 minutes  Critical care time was exclusive of separately billable procedures and treating other patients.  Critical care was necessary to treat or prevent imminent or life-threatening deterioration.  Critical care was time spent personally by me on the following activities: development of treatment plan with patient and/or surrogate as well as nursing, discussions with consultants, evaluation of patient's response to treatment, examination of patient, obtaining history from patient or surrogate, ordering and performing treatments and interventions, ordering  and review of laboratory studies, ordering and review of radiographic studies, pulse oximetry and re-evaluation of patient's condition.   Observation: no ____________________________________________   INITIAL IMPRESSION / ASSESSMENT AND PLAN / ED COURSE  Pertinent labs & imaging results that were available during my care of the patient were reviewed by me and considered in my medical decision making (see chart for details).  On arrival the patient is somewhat diaphoretic with a Levine sign and uncomfortable-appearing raising concern for acute coronary syndrome. First EKG does not show a STEMI, and repeat EKG 15 minutes later is the same. Regardless my clinical suspicion for acute coronary syndrome is quite high and he will require inpatient admission at the very least.     ----------------------------------------- 8:30 AM on 05/26/2017 -----------------------------------------  After 3 sublingual nitroglycerin the patient's pain is now down to a 0 out of 10. ____________________________________________  ----------------------------------------- 11:23 AM on 05/26/2017 -----------------------------------------  Unfortunately the patient has decided to leave the hospital AGAINST MEDICAL ADVICE. I discussed the risks of leaving up to and including death and the patient verbalized understanding. The patient's wife and his daughter at  bedside and urge the patient to stay, however he declined stating he does not like hospitals and he wants to go home. I explained to him that I'm concerned that he is currently having a heart attack and that he is at significant risk for death today should he not stay in the hospital. He verbalizes understanding and said that he would accept a second blood tests, however he would not stay for the results.  FINAL CLINICAL IMPRESSION(S) / ED DIAGNOSES  Final diagnoses:  Chest pain due to myocardial ischemia, unspecified ischemic chest pain type  Syncope,  unspecified syncope type  Anemia, unspecified type      NEW MEDICATIONS STARTED DURING THIS VISIT:  Discharge Medication List as of 05/26/2017 11:29 AM       Note:  This document was prepared using Dragon voice recognition software and may include unintentional dictation errors.     Merrily Brittle, MD 05/27/17 1100

## 2017-05-26 NOTE — ED Triage Notes (Signed)
Pt presents to ED via POV with his boss. Per pt's boss pt had a syncopal episode while at work. Pt is alert and oriented upon arrival, assisted out of vehicle by this RN into wheelchair, pt is noted to be pale and diaphoretic, c/o L sided chest pain 10/10 that started this morning. Pt states hx of MI with 2 stents placed. Pt denies pain anywhere else.

## 2017-05-26 NOTE — ED Notes (Signed)
NAD noted at this time. Pt states understanding of possible consequences of leaving AMA at this time. Pt ambulatory to the lobby with his family.

## 2017-05-26 NOTE — ED Notes (Signed)
Pt received 3 doses SL nitro, pain from 10/10 to 0/10, final BP after 3 doses 122/72. Pt tolerated well. Pt is alert and oriented at this time. Will continue to monitor for further patient needs.

## 2017-05-26 NOTE — ED Notes (Signed)
Pt visualized in NAD, resting in bed at this time. Will continue to monitor for further patient needs. Pt's mother at bedside at this time.

## 2017-05-26 NOTE — Discharge Instructions (Addendum)
Today you had avery concerning chest pain in a worried that you are having a heart attack. I recommended that you be admitted to the hospital at least overnight for a complete evaluation of your heart, however you have declined in her leaving the hospital AGAINST MEDICAL ADVICE.  Please know that you are more than welcome to return to our hospital at any point today or in the future and we are more than happy to continue your medical care.  It has been too long since you have seen your cardiologist Dr. Juliann Paresallwood.  Please make an appointment to see him tomorrow for a recheck if you choose not to come back to our hospital.

## 2017-05-26 NOTE — ED Notes (Signed)
NAD noted at this time. Pt bed rail let down so that he was able to sit up on side of bed. This RN explained did not want patient to get out of bed and fall, however since his mother is sitting at bedside, pt could sit up on side of bed, pt states understanding at this time. Denies any needs at this time. Will continue to monitor for further patient needs.

## 2017-05-26 NOTE — ED Notes (Signed)
Pt looking at this RN from room. This RN explained new patient coming and would have to tend to new patient, would be with him ASAP. Pt states understanding. Pt's family at bedside, pt visualized in NAD>

## 2017-05-29 ENCOUNTER — Encounter
Admission: RE | Disposition: A | Discharge status: Home / Self Care | Payer: Self-pay | Source: Ambulatory Visit | Attending: Internal Medicine

## 2017-05-29 ENCOUNTER — Ambulatory Visit
Admission: RE | Admit: 2017-05-29 | Discharge: 2017-05-30 | Disposition: A | Discharge status: Home / Self Care | Payer: PRIVATE HEALTH INSURANCE | Source: Ambulatory Visit | Attending: Internal Medicine | Admitting: Internal Medicine

## 2017-05-29 DIAGNOSIS — I25110 Atherosclerotic heart disease of native coronary artery with unstable angina pectoris: Secondary | ICD-10-CM | POA: Diagnosis not present

## 2017-05-29 DIAGNOSIS — I252 Old myocardial infarction: Secondary | ICD-10-CM | POA: Insufficient documentation

## 2017-05-29 DIAGNOSIS — E782 Mixed hyperlipidemia: Secondary | ICD-10-CM | POA: Diagnosis not present

## 2017-05-29 DIAGNOSIS — Z7982 Long term (current) use of aspirin: Secondary | ICD-10-CM | POA: Diagnosis not present

## 2017-05-29 DIAGNOSIS — I200 Unstable angina: Secondary | ICD-10-CM | POA: Diagnosis present

## 2017-05-29 DIAGNOSIS — I10 Essential (primary) hypertension: Secondary | ICD-10-CM | POA: Diagnosis not present

## 2017-05-29 DIAGNOSIS — Z7902 Long term (current) use of antithrombotics/antiplatelets: Secondary | ICD-10-CM | POA: Diagnosis not present

## 2017-05-29 DIAGNOSIS — Z955 Presence of coronary angioplasty implant and graft: Secondary | ICD-10-CM | POA: Insufficient documentation

## 2017-05-29 DIAGNOSIS — Z79899 Other long term (current) drug therapy: Secondary | ICD-10-CM | POA: Insufficient documentation

## 2017-05-29 DIAGNOSIS — F17200 Nicotine dependence, unspecified, uncomplicated: Secondary | ICD-10-CM | POA: Diagnosis not present

## 2017-05-29 HISTORY — PX: LEFT HEART CATH AND CORONARY ANGIOGRAPHY: CATH118249

## 2017-05-29 HISTORY — PX: CORONARY STENT INTERVENTION: CATH118234

## 2017-05-29 LAB — CARDIAC CATHETERIZATION: CATHEFQUANT: 60 %

## 2017-05-29 LAB — POCT ACTIVATED CLOTTING TIME: Activated Clotting Time: 334 seconds

## 2017-05-29 SURGERY — LEFT HEART CATH AND CORONARY ANGIOGRAPHY
Anesthesia: Moderate Sedation

## 2017-05-29 MED ORDER — SODIUM CHLORIDE 0.9 % WEIGHT BASED INFUSION
1.0000 mL/kg/h | INTRAVENOUS | Status: AC
  Administered 2017-05-29: 1 mL/kg/h via INTRAVENOUS

## 2017-05-29 MED ORDER — FENTANYL CITRATE (PF) 100 MCG/2ML IJ SOLN
INTRAMUSCULAR | Status: DC | PRN
  Administered 2017-05-29: 25 ug via INTRAVENOUS

## 2017-05-29 MED ORDER — ASPIRIN 81 MG PO CHEW
81.0000 mg | CHEWABLE_TABLET | Freq: Every day | ORAL | Status: DC
  Administered 2017-05-30: 81 mg via ORAL
  Filled 2017-05-29: qty 1

## 2017-05-29 MED ORDER — BIVALIRUDIN BOLUS VIA INFUSION - CUPID
INTRAVENOUS | Status: DC | PRN
  Administered 2017-05-29: 62.25 mg via INTRAVENOUS

## 2017-05-29 MED ORDER — SODIUM CHLORIDE 0.9 % IV SOLN
INTRAVENOUS | Status: DC
  Administered 2017-05-29: 13:00:00 via INTRAVENOUS

## 2017-05-29 MED ORDER — SODIUM CHLORIDE 0.9% FLUSH: 3.0000 mL | Freq: Two times a day (BID) | INTRAVENOUS | Status: DC

## 2017-05-29 MED ORDER — HEPARIN (PORCINE) IN NACL 2-0.9 UNIT/ML-% IJ SOLN
INTRAMUSCULAR | Status: AC
  Filled 2017-05-29: qty 500

## 2017-05-29 MED ORDER — SODIUM CHLORIDE 0.9 % IV SOLN: 250.0000 mL | INTRAVENOUS | Status: DC | PRN

## 2017-05-29 MED ORDER — BIVALIRUDIN TRIFLUOROACETATE 250 MG IV SOLR
INTRAVENOUS | Status: AC
  Filled 2017-05-29: qty 250

## 2017-05-29 MED ORDER — ASPIRIN 81 MG PO CHEW
CHEWABLE_TABLET | ORAL | Status: DC | PRN
  Administered 2017-05-29: 243 mg via ORAL

## 2017-05-29 MED ORDER — MIDAZOLAM HCL 2 MG/2ML IJ SOLN
INTRAMUSCULAR | Status: AC
  Filled 2017-05-29: qty 2

## 2017-05-29 MED ORDER — ACETAMINOPHEN 325 MG PO TABS: 650.0000 mg | ORAL_TABLET | ORAL | Status: DC | PRN

## 2017-05-29 MED ORDER — SODIUM CHLORIDE 0.9 % WEIGHT BASED INFUSION: 3.0000 mL/kg/h | INTRAVENOUS | Status: DC

## 2017-05-29 MED ORDER — FENTANYL CITRATE (PF) 100 MCG/2ML IJ SOLN
INTRAMUSCULAR | Status: AC
  Filled 2017-05-29: qty 2

## 2017-05-29 MED ORDER — TICAGRELOR 90 MG PO TABS
ORAL_TABLET | ORAL | Status: DC | PRN
  Administered 2017-05-29: 180 mg via ORAL

## 2017-05-29 MED ORDER — ASPIRIN 81 MG PO CHEW
CHEWABLE_TABLET | ORAL | Status: AC
  Filled 2017-05-29: qty 3

## 2017-05-29 MED ORDER — NITROGLYCERIN 5 MG/ML IV SOLN
INTRAVENOUS | Status: AC
  Filled 2017-05-29: qty 10

## 2017-05-29 MED ORDER — IOPAMIDOL (ISOVUE-300) INJECTION 61%
INTRAVENOUS | Status: DC | PRN
  Administered 2017-05-29: 230 mL via INTRA_ARTERIAL

## 2017-05-29 MED ORDER — ASPIRIN 81 MG PO CHEW: 81.0000 mg | CHEWABLE_TABLET | ORAL | Status: DC

## 2017-05-29 MED ORDER — SODIUM CHLORIDE 0.9% FLUSH: 3.0000 mL | INTRAVENOUS | Status: DC | PRN

## 2017-05-29 MED ORDER — TICAGRELOR 90 MG PO TABS
ORAL_TABLET | ORAL | Status: AC
  Filled 2017-05-29: qty 2

## 2017-05-29 MED ORDER — SODIUM CHLORIDE 0.9 % IV SOLN
INTRAVENOUS | Status: AC | PRN
  Administered 2017-05-29: 1.75 mg/kg/h via INTRAVENOUS

## 2017-05-29 MED ORDER — LABETALOL HCL 5 MG/ML IV SOLN: 10.0000 mg | INTRAVENOUS | Status: AC | PRN

## 2017-05-29 MED ORDER — TICAGRELOR 90 MG PO TABS
90.0000 mg | ORAL_TABLET | Freq: Two times a day (BID) | ORAL | Status: DC
  Administered 2017-05-29 – 2017-05-30 (×2): 90 mg via ORAL
  Filled 2017-05-29 (×3): qty 1

## 2017-05-29 MED ORDER — ONDANSETRON HCL 4 MG/2ML IJ SOLN: 4.0000 mg | Freq: Four times a day (QID) | INTRAMUSCULAR | Status: DC | PRN

## 2017-05-29 MED ORDER — SODIUM CHLORIDE 0.9 % WEIGHT BASED INFUSION: 1.0000 mL/kg/h | INTRAVENOUS | Status: DC

## 2017-05-29 MED ORDER — SODIUM CHLORIDE 0.9 % IV SOLN
0.2500 mg/kg/h | INTRAVENOUS | Status: AC
  Filled 2017-05-29: qty 250

## 2017-05-29 MED ORDER — MIDAZOLAM HCL 2 MG/2ML IJ SOLN
INTRAMUSCULAR | Status: DC | PRN
  Administered 2017-05-29: 1 mg via INTRAVENOUS

## 2017-05-29 MED ORDER — HYDRALAZINE HCL 20 MG/ML IJ SOLN: 5.0000 mg | INTRAMUSCULAR | Status: AC | PRN

## 2017-05-29 SURGICAL SUPPLY — 17 items
BALLN TREK RX 2.25X15 (BALLOONS) ×2
BALLOON TREK RX 2.25X15 (BALLOONS) ×1 IMPLANT
CATH INFINITI 5FR ANG PIGTAIL (CATHETERS) ×2 IMPLANT
CATH INFINITI 5FR JL4 (CATHETERS) ×2 IMPLANT
CATH INFINITI JR4 5F (CATHETERS) ×2 IMPLANT
CATH VISTA GUIDE 6FR XB3.5 SH (CATHETERS) ×2 IMPLANT
DEVICE CLOSURE MYNXGRIP 6/7F (Vascular Products) ×2 IMPLANT
DEVICE INFLAT 30 PLUS (MISCELLANEOUS) ×2 IMPLANT
KIT MANI 3VAL PERCEP (MISCELLANEOUS) ×2 IMPLANT
NEEDLE PERC 18GX7CM (NEEDLE) ×2 IMPLANT
PACK CARDIAC CATH (CUSTOM PROCEDURE TRAY) ×2 IMPLANT
SHEATH AVANTI 5FR X 11CM (SHEATH) ×2 IMPLANT
SHEATH AVANTI 6FR X 11CM (SHEATH) ×2 IMPLANT
STENT XIENCE ALPINE RX 2.25X12 (Permanent Stent) ×2 IMPLANT
WIRE EMERALD 3MM-J .035X150CM (WIRE) ×2 IMPLANT
WIRE G HI TQ BMW 190 (WIRE) ×2 IMPLANT
WIRE HITORQ VERSACORE ST 145CM (WIRE) ×2 IMPLANT

## 2017-05-30 ENCOUNTER — Encounter: Payer: Self-pay | Admitting: Internal Medicine

## 2017-05-30 DIAGNOSIS — I25110 Atherosclerotic heart disease of native coronary artery with unstable angina pectoris: Secondary | ICD-10-CM | POA: Diagnosis not present

## 2017-05-30 NOTE — Progress Notes (Signed)
Jordan [name], Rolin R 46 year old male, post left heart cath.He has no acute event overnight. Cath sites are free from hematoma or bleeding. Patient remained NSR with stable VS. He rested for most of the night.

## 2017-05-30 NOTE — Final Progress Note (Signed)
Physician Final Progress Note  Patient ID: Jordan [name] MRN: [id number] DOB/AGE: [birth year] 46 y.o.  Admit date: 05/29/2017 Admitting provider: Alwyn Peawayne D Salina Stanfield, MD Discharge date: 05/30/2017   Admission Diagnoses: Unstable angina coronary artery disease  Discharge Diagnoses: Status post PCI and stent to LAD with DES Active Problems:   Unstable angina (HCC) Coronary artery disease Hyperlipidemia Hypertension  Consults: None  Significant Findings/ Diagnostic Studies: Cardiac catheter and PCI and stent  Procedures: Cardiac cath  PCI and stent DES to LAD  Discharge Condition: good  Disposition: 01-Home or Self Care  Diet: Cardiac diet  Discharge Activity: No lifting, driving, or strenuous exercise for one week  Discharge Instructions    AMB Referral to Cardiac Rehabilitation - Phase II    Complete by:  As directed    Diagnosis:   Coronary Stents NSTEMI       Allergies as of 05/30/2017   No Known Allergies     Medication List    TAKE these medications   albuterol 108 (90 Base) MCG/ACT inhaler Commonly known as:  PROVENTIL HFA;VENTOLIN HFA Inhale 2 puffs into the lungs every 6 (six) hours as needed. For wheezing.   aspirin EC 81 MG tablet Take 81 mg by mouth daily.   clopidogrel 75 MG tablet Commonly known as:  PLAVIX Take 75 mg by mouth daily.   isosorbide mononitrate 10 MG tablet Commonly known as:  ISMO,MONOKET Take 30 mg by mouth 2 (two) times daily.   metoprolol succinate 25 MG 24 hr tablet Commonly known as:  TOPROL-XL Take 25 mg by mouth daily.   simvastatin 40 MG tablet Commonly known as:  ZOCOR Take 40 mg by mouth.        Total time spent taking care of this patient: 30  minutes  Signed: Fuad Forget D [name] 05/30/2017, 12:30 PM

## 2017-07-11 ENCOUNTER — Other Ambulatory Visit: Payer: Self-pay | Admitting: Nurse Practitioner

## 2017-07-11 DIAGNOSIS — R569 Unspecified convulsions: Secondary | ICD-10-CM

## 2017-07-17 ENCOUNTER — Ambulatory Visit
Admission: RE | Admit: 2017-07-17 | Discharge: 2017-07-17 | Disposition: A | Discharge status: Home / Self Care | Payer: PRIVATE HEALTH INSURANCE | Source: Ambulatory Visit | Attending: Nurse Practitioner | Admitting: Nurse Practitioner

## 2017-07-17 DIAGNOSIS — R569 Unspecified convulsions: Secondary | ICD-10-CM | POA: Insufficient documentation

## 2017-07-17 LAB — POCT I-STAT CREATININE: CREATININE: 0.9 mg/dL (ref 0.61–1.24)

## 2017-07-17 MED ORDER — GADOBENATE DIMEGLUMINE 529 MG/ML IV SOLN
20.0000 mL | Freq: Once | INTRAVENOUS | Status: AC | PRN
  Administered 2017-07-17: 17 mL via INTRAVENOUS

## 2017-12-28 DIAGNOSIS — D509 Iron deficiency anemia, unspecified: Secondary | ICD-10-CM | POA: Insufficient documentation

## 2017-12-28 NOTE — Progress Notes (Signed)
[facility]  Telephone:(336) [phone] Fax:(336) [phone]  ID: Jordan [name] OB: Aug 06, 1971  MR#: [id number]  AOZ#:[id number]#:[id number]  Patient Care Team: Patient, No Pcp Per as PCP - General (General Practice)  CHIEF COMPLAINT: Microcytic anemia.  INTERVAL HISTORY: Patient is a 47 year old male who was noted to have significantly decreased hemoglobin on routine blood work.  He has increased weakness and fatigue, but otherwise feels well.  He has no neurologic complaints.  He denies any recent fevers or illnesses.  He has a good appetite and denies weight loss.  He denies any chest pain, shortness of breath, cough, or hemoptysis.  He has no nausea, vomiting, constipation, or diarrhea.  He has no melena or hematochezia.  He has no urinary complaints.  Patient otherwise feels well and offers no further specific complaints  REVIEW OF SYSTEMS:   Review of Systems  Constitutional: Positive for malaise/fatigue. Negative for fever and weight loss.  Respiratory: Negative.  Negative for cough, hemoptysis and shortness of breath.   Cardiovascular: Negative.  Negative for chest pain and leg swelling.  Gastrointestinal: Negative.  Negative for abdominal pain, blood in stool and melena.  Genitourinary: Negative.  Negative for hematuria.  Skin: Negative.  Negative for rash.  Neurological: Positive for weakness. Negative for sensory change.  Psychiatric/Behavioral: Negative.  The patient is not nervous/anxious.     As per HPI. Otherwise, a complete review of systems is negative.  PAST MEDICAL HISTORY: Past Medical History:  Diagnosis Date  . Angina pectoris (HCC)   . Coronary disease   . Hypercholesterolemia   . Hypertension   . Myocardial infarction (HCC)   . Smoking     PAST SURGICAL HISTORY: Past Surgical History:  Procedure Laterality Date  . CORONARY STENT INTERVENTION N/A 05/29/2017   Procedure: Coronary Stent Intervention;  Surgeon: Alwyn Peaallwood, Dwayne D, MD;  Location: [facility]  INVASIVE CV LAB;  Service: Cardiovascular;  Laterality: N/A;  . LEFT HEART CATH AND CORONARY ANGIOGRAPHY N/A 05/29/2017   Procedure: Left Heart Cath and Coronary Angiography;  Surgeon: Alwyn Peaallwood, Dwayne D, MD;  Location: [facility] INVASIVE CV LAB;  Service: Cardiovascular;  Laterality: N/A;  . PCI and stent x 2 with DES      FAMILY HISTORY: Family History  Problem Relation Age of Onset  . Diabetes Mother   . Hypertension Mother   . Heart Problems Father   . High Cholesterol Father   . Hypertension Maternal Grandmother   . Arthritis Maternal Grandmother   . Hyperthyroidism Maternal Grandmother   . Heart attack Maternal Grandfather     ADVANCED DIRECTIVES (Y/N):  N  HEALTH MAINTENANCE: Social History   Tobacco Use  . Smoking status: Current Every Day Smoker    Packs/day: 1.00    Years: 20.00    Pack years: 20.00    Types: Cigarettes  . Smokeless tobacco: Never Used  Substance Use Topics  . Alcohol use: Yes    Alcohol/week: 0.0 oz    Comment: beer, moonshine  . Drug use: Yes    Types: Marijuana    Comment: Occ use.     Colonoscopy:  PAP:  Bone density:  Lipid panel:  No Known Allergies  Current Outpatient Medications  Medication Sig Dispense Refill  . albuterol (PROVENTIL HFA;VENTOLIN HFA) 108 (90 Base) MCG/ACT inhaler Inhale 2 puffs into the lungs every 6 (six) hours as needed. For wheezing.    Marland Kitchen. aspirin EC 81 MG tablet Take 81 mg by mouth daily.     Marland Kitchen. atorvastatin (LIPITOR) 80 MG  tablet Take 80 mg by mouth daily.  2  . BRILINTA 90 MG TABS tablet TAKE 1 TABLET (90 MG TOTAL) BY MOUTH 2 (TWO) TIMES DAILY  5  . Cyanocobalamin (VITAMIN B12 PO) Take by mouth.    . Ferrous Sulfate (IRON) 325 (65 Fe) MG TABS Take by mouth.    . isosorbide mononitrate (ISMO,MONOKET) 10 MG tablet Take 30 mg by mouth 2 (two) times daily.    . metoprolol succinate (TOPROL-XL) 25 MG 24 hr tablet Take 25 mg by mouth daily.    . simvastatin (ZOCOR) 40 MG tablet Take 40 mg by mouth.      No current  facility-administered medications for this visit.     OBJECTIVE: Vitals:   12/31/17 1539  BP: (!) 160/75  Pulse: 89  Resp: 18  Temp: 97.7 F (36.5 C)     Body mass index is 28.54 kg/m.    ECOG FS:0 - Asymptomatic  General: Well-developed, well-nourished, no acute distress. Eyes: Pink conjunctiva, anicteric sclera. HEENT: Normocephalic, moist mucous membranes, clear oropharnyx. Lungs: Clear to auscultation bilaterally. Heart: Regular rate and rhythm. No rubs, murmurs, or gallops. Abdomen: Soft, nontender, nondistended. No organomegaly noted, normoactive bowel sounds. Musculoskeletal: No edema, cyanosis, or clubbing. Neuro: Alert, answering all questions appropriately. Cranial nerves grossly intact. Skin: No rashes or petechiae noted. Psych: Normal affect. Lymphatics: No cervical, calvicular, axillary or inguinal LAD.   LAB RESULTS:  Lab Results  Component Value Date   NA 135 05/26/2017   K 3.6 05/26/2017   CL 104 05/26/2017   CO2 24 05/26/2017   GLUCOSE 153 (H) 05/26/2017   BUN 12 05/26/2017   CREATININE 0.90 07/17/2017   CALCIUM 8.9 05/26/2017   PROT 7.3 05/26/2017   ALBUMIN 3.9 05/26/2017   AST 22 05/26/2017   ALT 16 (L) 05/26/2017   ALKPHOS 76 05/26/2017   BILITOT 0.6 05/26/2017   GFRNONAA >60 05/26/2017   GFRAA >60 05/26/2017    Lab Results  Component Value Date   WBC 10.8 (H) 12/31/2017   NEUTROABS 7.2 (H) 05/26/2017   HGB 8.7 (L) 12/31/2017   HCT 29.6 (L) 12/31/2017   MCV 60.6 (L) 12/31/2017   PLT 406 12/31/2017   Lab Results  Component Value Date   IRON 20 (L) 12/31/2017   TIBC 506 (H) 12/31/2017   IRONPCTSAT 4 (L) 12/31/2017   Lab Results  Component Value Date   FERRITIN 6 (L) 12/31/2017     STUDIES: No results found.  ASSESSMENT: Microcytic anemia  PLAN:    1. Microcytic anemia: Patient found to have significant iron deficiency anemia.  Unclear etiology.  All the remainder of his blood work including hemolysis labs, hemoglobinopathy  profile, folate, B12, and SPEP are all negative or within normal limits.  His erythropoietin level is appropriately elevated.  Patient will return to clinic in 2 weeks for discussion of his laboratory work and initiation of IV iron.  Given his age, he will also require a GI referral for consideration of colonoscopy and EGD.  Approximately 45 minutes was spent in discussion of which greater than 50% was consultation.  Patient expressed understanding and was in agreement with this plan. He also understands that He can call clinic at any time with any questions, concerns, or complaints.   Cancer Staging No matching staging information was found for the patient.  Jeralyn Ruths, MD   01/02/2018 8:14 AM

## 2017-12-31 ENCOUNTER — Inpatient Hospital Stay: Payer: PRIVATE HEALTH INSURANCE

## 2017-12-31 ENCOUNTER — Encounter: Payer: Self-pay | Admitting: Oncology

## 2017-12-31 ENCOUNTER — Inpatient Hospital Stay: Payer: PRIVATE HEALTH INSURANCE | Attending: Oncology | Admitting: Oncology

## 2017-12-31 DIAGNOSIS — I2510 Atherosclerotic heart disease of native coronary artery without angina pectoris: Secondary | ICD-10-CM | POA: Diagnosis not present

## 2017-12-31 DIAGNOSIS — I10 Essential (primary) hypertension: Secondary | ICD-10-CM | POA: Insufficient documentation

## 2017-12-31 DIAGNOSIS — Z955 Presence of coronary angioplasty implant and graft: Secondary | ICD-10-CM | POA: Insufficient documentation

## 2017-12-31 DIAGNOSIS — D509 Iron deficiency anemia, unspecified: Secondary | ICD-10-CM

## 2017-12-31 DIAGNOSIS — Z79899 Other long term (current) drug therapy: Secondary | ICD-10-CM | POA: Diagnosis not present

## 2017-12-31 DIAGNOSIS — I252 Old myocardial infarction: Secondary | ICD-10-CM | POA: Insufficient documentation

## 2017-12-31 DIAGNOSIS — E7800 Pure hypercholesterolemia, unspecified: Secondary | ICD-10-CM | POA: Insufficient documentation

## 2017-12-31 DIAGNOSIS — F17210 Nicotine dependence, cigarettes, uncomplicated: Secondary | ICD-10-CM | POA: Diagnosis not present

## 2017-12-31 DIAGNOSIS — Z7982 Long term (current) use of aspirin: Secondary | ICD-10-CM | POA: Diagnosis not present

## 2017-12-31 LAB — RETICULOCYTES
RBC.: 4.88 MIL/uL (ref 4.40–5.90)
RETIC CT PCT: 4.1 % — AB (ref 0.4–3.1)
Retic Count, Absolute: 200.1 10*3/uL — ABNORMAL HIGH (ref 19.0–183.0)

## 2017-12-31 LAB — IRON AND TIBC
Iron: 20 ug/dL — ABNORMAL LOW (ref 45–182)
SATURATION RATIOS: 4 % — AB (ref 17.9–39.5)
TIBC: 506 ug/dL — ABNORMAL HIGH (ref 250–450)
UIBC: 486 ug/dL

## 2017-12-31 LAB — CBC
HCT: 29.6 % — ABNORMAL LOW (ref 40.0–52.0)
Hemoglobin: 8.7 g/dL — ABNORMAL LOW (ref 13.0–18.0)
MCH: 17.8 pg — AB (ref 26.0–34.0)
MCHC: 29.4 g/dL — ABNORMAL LOW (ref 32.0–36.0)
MCV: 60.6 fL — ABNORMAL LOW (ref 80.0–100.0)
PLATELETS: 406 10*3/uL (ref 150–440)
RBC: 4.88 MIL/uL (ref 4.40–5.90)
RDW: 25 % — ABNORMAL HIGH (ref 11.5–14.5)
WBC: 10.8 10*3/uL — AB (ref 3.8–10.6)

## 2017-12-31 LAB — FERRITIN: Ferritin: 6 ng/mL — ABNORMAL LOW (ref 24–336)

## 2017-12-31 LAB — VITAMIN B12: VITAMIN B 12: 511 pg/mL (ref 180–914)

## 2017-12-31 LAB — LACTATE DEHYDROGENASE: LDH: 106 U/L (ref 98–192)

## 2017-12-31 LAB — FOLATE: Folate: 7.2 ng/mL (ref >5.9)

## 2017-12-31 LAB — DAT, POLYSPECIFIC AHG (ARMC ONLY): Polyspecific AHG test: NEGATIVE

## 2018-01-01 LAB — ERYTHROPOIETIN: Erythropoietin: 131.9 m[IU]/mL — ABNORMAL HIGH (ref 2.6–18.5)

## 2018-01-01 LAB — PROTEIN ELECTROPHORESIS, SERUM
A/G RATIO SPE: 1.2 (ref 0.7–1.7)
ALPHA-2-GLOBULIN: 0.7 g/dL (ref 0.4–1.0)
Albumin ELP: 3.8 g/dL (ref 2.9–4.4)
Alpha-1-Globulin: 0.2 g/dL (ref 0.0–0.4)
Beta Globulin: 1.1 g/dL (ref 0.7–1.3)
GLOBULIN, TOTAL: 3.3 g/dL (ref 2.2–3.9)
Gamma Globulin: 1.2 g/dL (ref 0.4–1.8)
TOTAL PROTEIN ELP: 7.1 g/dL (ref 6.0–8.5)

## 2018-01-01 LAB — HAPTOGLOBIN: Haptoglobin: 169 mg/dL (ref 34–200)

## 2018-01-01 LAB — HEMOGLOBINOPATHY EVALUATION
HGB A2 QUANT: 1.4 % — AB (ref 1.8–3.2)
HGB F QUANT: 0 % (ref 0.0–2.0)
Hgb A: 98.6 % (ref 96.4–98.8)
Hgb C: 0 %
Hgb S Quant: 0 %
Hgb Variant: 0 %

## 2018-01-01 LAB — ANA W/REFLEX: ANA: NEGATIVE

## 2018-01-09 NOTE — Progress Notes (Signed)
[facility]  Telephone:(336) [phone] Fax:(336) [phone]  ID: Jordan [name] OB: 1971-12-02  MR#: [id number]  KGM#:[id number]#:[id number]  Patient Care Team: Patient, No Pcp Per as PCP - General (General Practice)  CHIEF COMPLAINT: Iron deficiency anemia.  INTERVAL HISTORY: Patient returns to clinic today for further evaluation,discussion of his laboratory work, and initiation of IV Feraheme.  He continues to have mild weakness and fatigue, but otherwise feels well. He has no neurologic complaints.  He denies any recent fevers or illnesses.  He has a good appetite and denies weight loss.  He denies any chest pain, shortness of breath, cough, or hemoptysis.  He has no nausea, vomiting, constipation, or diarrhea.  He has no melena or hematochezia.  He has no urinary complaints.  Patient offers no further specific complaints  REVIEW OF SYSTEMS:   Review of Systems  Constitutional: Positive for malaise/fatigue. Negative for fever and weight loss.  Respiratory: Negative.  Negative for cough, hemoptysis and shortness of breath.   Cardiovascular: Negative.  Negative for chest pain and leg swelling.  Gastrointestinal: Negative.  Negative for abdominal pain, blood in stool and melena.  Genitourinary: Negative.  Negative for hematuria.  Musculoskeletal: Negative.   Skin: Negative.  Negative for rash.  Neurological: Positive for weakness. Negative for sensory change.  Psychiatric/Behavioral: Negative.  The patient is not nervous/anxious.     As per HPI. Otherwise, a complete review of systems is negative.  PAST MEDICAL HISTORY: Past Medical History:  Diagnosis Date  . Angina pectoris (HCC)   . Coronary disease   . Hypercholesterolemia   . Hypertension   . Myocardial infarction (HCC)   . Smoking     PAST SURGICAL HISTORY: Past Surgical History:  Procedure Laterality Date  . CORONARY STENT INTERVENTION N/A 05/29/2017   Procedure: Coronary Stent Intervention;  Surgeon: Alwyn Peaallwood,  Dwayne D, MD;  Location: [facility] INVASIVE CV LAB;  Service: Cardiovascular;  Laterality: N/A;  . LEFT HEART CATH AND CORONARY ANGIOGRAPHY N/A 05/29/2017   Procedure: Left Heart Cath and Coronary Angiography;  Surgeon: Alwyn Peaallwood, Dwayne D, MD;  Location: [facility] INVASIVE CV LAB;  Service: Cardiovascular;  Laterality: N/A;  . PCI and stent x 2 with DES      FAMILY HISTORY: Family History  Problem Relation Age of Onset  . Diabetes Mother   . Hypertension Mother   . Heart Problems Father   . High Cholesterol Father   . Hypertension Maternal Grandmother   . Arthritis Maternal Grandmother   . Hyperthyroidism Maternal Grandmother   . Heart attack Maternal Grandfather     ADVANCED DIRECTIVES (Y/N):  N  HEALTH MAINTENANCE: Social History   Tobacco Use  . Smoking status: Current Every Day Smoker    Packs/day: 1.00    Years: 20.00    Pack years: 20.00    Types: Cigarettes  . Smokeless tobacco: Never Used  Substance Use Topics  . Alcohol use: Yes    Alcohol/week: 0.0 oz    Comment: beer, moonshine  . Drug use: Yes    Types: Marijuana    Comment: Occ use.     Colonoscopy:  PAP:  Bone density:  Lipid panel:  No Known Allergies  Current Outpatient Medications  Medication Sig Dispense Refill  . albuterol (PROVENTIL HFA;VENTOLIN HFA) 108 (90 Base) MCG/ACT inhaler Inhale 2 puffs into the lungs every 6 (six) hours as needed. For wheezing.    Marland Kitchen. aspirin EC 81 MG tablet Take 81 mg by mouth daily.     Marland Kitchen. BRILINTA 90  MG TABS tablet TAKE 1 TABLET (90 MG TOTAL) BY MOUTH 2 (TWO) TIMES DAILY  5  . Cyanocobalamin (VITAMIN B12 PO) Take by mouth.    . Ferrous Sulfate (IRON) 325 (65 Fe) MG TABS Take by mouth.    . isosorbide mononitrate (ISMO,MONOKET) 10 MG tablet Take 30 mg by mouth 2 (two) times daily.    . metoprolol succinate (TOPROL-XL) 25 MG 24 hr tablet Take 25 mg by mouth daily.    . simvastatin (ZOCOR) 40 MG tablet Take 40 mg by mouth.      No current facility-administered medications for  this visit.     OBJECTIVE: Vitals:   01/15/18 1401  BP: (!) 164/84  Pulse: 81  Resp: 18  Temp: (!) 96.6 F (35.9 C)     Body mass index is 28.88 kg/m.    ECOG FS:0 - Asymptomatic  General: Well-developed, well-nourished, no acute distress. Eyes: Pink conjunctiva, anicteric sclera. Lungs: Clear to auscultation bilaterally. Heart: Regular rate and rhythm. No rubs, murmurs, or gallops. Abdomen: Soft, nontender, nondistended. No organomegaly noted, normoactive bowel sounds. Musculoskeletal: No edema, cyanosis, or clubbing. Neuro: Alert, answering all questions appropriately. Cranial nerves grossly intact. Skin: No rashes or petechiae noted. Psych: Normal affect.  LAB RESULTS:  Lab Results  Component Value Date   NA 135 05/26/2017   K 3.6 05/26/2017   CL 104 05/26/2017   CO2 24 05/26/2017   GLUCOSE 153 (H) 05/26/2017   BUN 12 05/26/2017   CREATININE 0.90 07/17/2017   CALCIUM 8.9 05/26/2017   PROT 7.3 05/26/2017   ALBUMIN 3.9 05/26/2017   AST 22 05/26/2017   ALT 16 (L) 05/26/2017   ALKPHOS 76 05/26/2017   BILITOT 0.6 05/26/2017   GFRNONAA >60 05/26/2017   GFRAA >60 05/26/2017    Lab Results  Component Value Date   WBC 10.8 (H) 12/31/2017   NEUTROABS 7.2 (H) 05/26/2017   HGB 8.7 (L) 12/31/2017   HCT 29.6 (L) 12/31/2017   MCV 60.6 (L) 12/31/2017   PLT 406 12/31/2017   Lab Results  Component Value Date   IRON 20 (L) 12/31/2017   TIBC 506 (H) 12/31/2017   IRONPCTSAT 4 (L) 12/31/2017   Lab Results  Component Value Date   FERRITIN 6 (L) 12/31/2017     STUDIES: No results found.  ASSESSMENT: Iron deficiency anemia  PLAN:    1. Iron deficiency anemia: Patient found to have significant iron deficiency anemia.  Unclear etiology.  All the remainder of his blood work including hemolysis labs, hemoglobinopathy profile, folate, B12, and SPEP are all negative or within normal limits.  His erythropoietin level is appropriately elevated.  Proceed with 510 mg IV  Feraheme today.  Return to clinic in 1 week for second infusion and then in 3 months for repeat laboratory work and further evaluation.  Given his age, he will also require a GI referral for consideration of colonoscopy and EGD.  Approximately 30 minutes was spent in discussion of which greater than 50% was consultation.  Patient expressed understanding and was in agreement with this plan. He also understands that He can call clinic at any time with any questions, concerns, or complaints.    Jeralyn Ruths, MD   01/18/2018 7:44 AM

## 2018-01-13 ENCOUNTER — Other Ambulatory Visit: Payer: Self-pay | Admitting: Oncology

## 2018-01-15 ENCOUNTER — Inpatient Hospital Stay: Payer: PRIVATE HEALTH INSURANCE | Attending: Oncology | Admitting: Oncology

## 2018-01-15 ENCOUNTER — Inpatient Hospital Stay: Payer: PRIVATE HEALTH INSURANCE

## 2018-01-15 ENCOUNTER — Encounter: Payer: Self-pay | Admitting: Oncology

## 2018-01-15 VITALS — BP 164/84 | HR 81 | Temp 96.6°F | Resp 18 | Wt 178.9 lb

## 2018-01-15 DIAGNOSIS — I252 Old myocardial infarction: Secondary | ICD-10-CM | POA: Diagnosis not present

## 2018-01-15 DIAGNOSIS — E7800 Pure hypercholesterolemia, unspecified: Secondary | ICD-10-CM | POA: Diagnosis not present

## 2018-01-15 DIAGNOSIS — F17210 Nicotine dependence, cigarettes, uncomplicated: Secondary | ICD-10-CM

## 2018-01-15 DIAGNOSIS — Z79899 Other long term (current) drug therapy: Secondary | ICD-10-CM

## 2018-01-15 DIAGNOSIS — D509 Iron deficiency anemia, unspecified: Secondary | ICD-10-CM

## 2018-01-15 DIAGNOSIS — I10 Essential (primary) hypertension: Secondary | ICD-10-CM | POA: Diagnosis not present

## 2018-01-15 MED ORDER — SODIUM CHLORIDE 0.9 % IV SOLN
510.0000 mg | Freq: Once | INTRAVENOUS | Status: AC
  Administered 2018-01-15: 510 mg via INTRAVENOUS
  Filled 2018-01-15: qty 17

## 2018-01-15 MED ORDER — SODIUM CHLORIDE 0.9 % IV SOLN
Freq: Once | INTRAVENOUS | Status: AC
  Administered 2018-01-15: 15:00:00 via INTRAVENOUS
  Filled 2018-01-15: qty 1000

## 2018-01-22 ENCOUNTER — Inpatient Hospital Stay: Payer: PRIVATE HEALTH INSURANCE

## 2018-01-22 VITALS — BP 139/84 | HR 62 | Temp 96.9°F | Resp 18

## 2018-01-22 DIAGNOSIS — D509 Iron deficiency anemia, unspecified: Secondary | ICD-10-CM | POA: Diagnosis not present

## 2018-01-22 MED ORDER — SODIUM CHLORIDE 0.9 % IV SOLN
Freq: Once | INTRAVENOUS | Status: AC
  Administered 2018-01-22: 14:00:00 via INTRAVENOUS
  Filled 2018-01-22: qty 1000

## 2018-01-22 MED ORDER — FERUMOXYTOL INJECTION 510 MG/17 ML
510.0000 mg | Freq: Once | INTRAVENOUS | Status: AC
  Administered 2018-01-22: 510 mg via INTRAVENOUS
  Filled 2018-01-22: qty 17

## 2018-01-22 NOTE — Progress Notes (Signed)
Pt tolerated infusion well. Pt and VS stable at discharge.  

## 2018-02-23 ENCOUNTER — Telehealth: Payer: Self-pay | Admitting: *Deleted

## 2018-02-23 NOTE — Telephone Encounter (Signed)
Yes definitely labs and see us while he is here.

## 2018-02-23 NOTE — Telephone Encounter (Signed)
Patient advised of Dr Orlie DakinFinnegan response and will call PCP

## 2018-02-23 NOTE — Telephone Encounter (Signed)
I see him for iron def anemia.  This episode seems unrelated to that's. He should have an evaluation by his PCP.

## 2018-02-23 NOTE — Telephone Encounter (Signed)
Received a call form a Jordan AfricaSandra [name] who is NOT listed on patient HIPPA form stating he needs to be seen for a "spell"  I called patient and he states he had last week was raking leaves last week and he got hot and passed out for a few minutes, he was very diaphoretic and weak for 15 mins. He did not go to Hospital stating this happens frequently.  His next appointment is in mid [place]y. Please advise if you want to move lab/ doctor/ inf appointment up or if you just want him to come in ofr lab check and go from there.

## 2018-04-12 NOTE — Progress Notes (Signed)
[facility]  Telephone:(336) [phone] Fax:(336) [phone]  ID: Jordan [name] OB: Nov 26, 1971  MR#: [id number]  AOZ#:[id number]  Patient Care Team: Patient, No Pcp Per as PCP - General (General Practice)  CHIEF COMPLAINT: Iron deficiency anemia.  INTERVAL HISTORY: Patient returns to clinic today for repeat laboratory work, further evaluation, and consideration of additional IV Feraheme.  Patient noted significant improvement after receiving Feraheme several months ago, but admits over the last several weeks he has had increasing weakness and fatigue as well as dyspnea on exertion.  He otherwise feels well.  He has no neurologic complaints.  He denies any recent fevers or illnesses.  He has a good appetite and denies weight loss.  He denies any chest pain, shortness of breath, cough, or hemoptysis.  He has no nausea, vomiting, constipation, or diarrhea.  He has no melena or hematochezia.  He has no urinary complaints.  Patient offers no further specific complaints today.  REVIEW OF SYSTEMS:   Review of Systems  Constitutional: Positive for malaise/fatigue. Negative for fever and weight loss.  Respiratory: Positive for shortness of breath. Negative for cough and hemoptysis.   Cardiovascular: Negative.  Negative for chest pain and leg swelling.  Gastrointestinal: Negative.  Negative for abdominal pain, blood in stool and melena.  Genitourinary: Negative.  Negative for hematuria.  Musculoskeletal: Negative.   Skin: Negative.  Negative for rash.  Neurological: Positive for weakness. Negative for sensory change.  Psychiatric/Behavioral: Negative.  The patient is not nervous/anxious.     As per HPI. Otherwise, a complete review of systems is negative.  PAST MEDICAL HISTORY: Past Medical History:  Diagnosis Date  . Angina pectoris (HCC)   . Coronary disease   . Hypercholesterolemia   . Hypertension   . Myocardial infarction (HCC)   . Smoking     PAST SURGICAL  HISTORY: Past Surgical History:  Procedure Laterality Date  . CORONARY STENT INTERVENTION N/A 05/29/2017   Procedure: Coronary Stent Intervention;  Surgeon: Alwyn Pea, MD;  Location: [facility] INVASIVE CV LAB;  Service: Cardiovascular;  Laterality: N/A;  . LEFT HEART CATH AND CORONARY ANGIOGRAPHY N/A 05/29/2017   Procedure: Left Heart Cath and Coronary Angiography;  Surgeon: Alwyn Pea, MD;  Location: [facility] INVASIVE CV LAB;  Service: Cardiovascular;  Laterality: N/A;  . PCI and stent x 2 with DES      FAMILY HISTORY: Family History  Problem Relation Age of Onset  . Diabetes Mother   . Hypertension Mother   . Heart Problems Father   . High Cholesterol Father   . Hypertension Maternal Grandmother   . Arthritis Maternal Grandmother   . Hyperthyroidism Maternal Grandmother   . Heart attack Maternal Grandfather     ADVANCED DIRECTIVES (Y/N):  N  HEALTH MAINTENANCE: Social History   Tobacco Use  . Smoking status: Current Every Day Smoker    Packs/day: 1.00    Years: 20.00    Pack years: 20.00    Types: Cigarettes  . Smokeless tobacco: Never Used  Substance Use Topics  . Alcohol use: Yes    Alcohol/week: 0.0 oz    Comment: beer, moonshine  . Drug use: Yes    Types: Marijuana    Comment: Occ use.     Colonoscopy:  PAP:  Bone density:  Lipid panel:  No Known Allergies  Current Outpatient Medications  Medication Sig Dispense Refill  . albuterol (PROVENTIL HFA;VENTOLIN HFA) 108 (90 Base) MCG/ACT inhaler Inhale 2 puffs into the lungs every 6 (six) hours as  needed. For wheezing.    Marland Kitchen aspirin EC 81 MG tablet Take 81 mg by mouth daily.     Marland Kitchen BRILINTA 90 MG TABS tablet TAKE 1 TABLET (90 MG TOTAL) BY MOUTH 2 (TWO) TIMES DAILY  5  . Cyanocobalamin (VITAMIN B12 PO) Take by mouth.    . Ferrous Sulfate (IRON) 325 (65 Fe) MG TABS Take by mouth.    . isosorbide mononitrate (ISMO,MONOKET) 10 MG tablet Take 30 mg by mouth 2 (two) times daily.    . metoprolol succinate  (TOPROL-XL) 25 MG 24 hr tablet Take 25 mg by mouth daily.    . simvastatin (ZOCOR) 40 MG tablet Take 40 mg by mouth.      No current facility-administered medications for this visit.     OBJECTIVE: Vitals:   04/14/18 1459  BP: (!) 162/76  Pulse: 77  Resp: 20  Temp: (!) 97.2 F (36.2 C)     Body mass index is 29.28 kg/m.    ECOG FS:0 - Asymptomatic  General: Well-developed, well-nourished, no acute distress. Eyes: Pink conjunctiva, anicteric sclera. Lungs: Clear to auscultation bilaterally. Heart: Regular rate and rhythm. No rubs, murmurs, or gallops. Abdomen: Soft, nontender, nondistended. No organomegaly noted, normoactive bowel sounds. Musculoskeletal: No edema, cyanosis, or clubbing. Neuro: Alert, answering all questions appropriately. Cranial nerves grossly intact. Skin: No rashes or petechiae noted. Psych: Normal affect.  LAB RESULTS:  Lab Results  Component Value Date   NA 135 05/26/2017   K 3.6 05/26/2017   CL 104 05/26/2017   CO2 24 05/26/2017   GLUCOSE 153 (H) 05/26/2017   BUN 12 05/26/2017   CREATININE 0.90 07/17/2017   CALCIUM 8.9 05/26/2017   PROT 7.3 05/26/2017   ALBUMIN 3.9 05/26/2017   AST 22 05/26/2017   ALT 16 (L) 05/26/2017   ALKPHOS 76 05/26/2017   BILITOT 0.6 05/26/2017   GFRNONAA >60 05/26/2017   GFRAA >60 05/26/2017    Lab Results  Component Value Date   WBC 7.6 04/14/2018   NEUTROABS 5.6 04/14/2018   HGB 11.5 (L) 04/14/2018   HCT 35.9 (L) 04/14/2018   MCV 73.5 (L) 04/14/2018   PLT 288 04/14/2018   Lab Results  Component Value Date   IRON 25 (L) 04/14/2018   TIBC 506 (H) 04/14/2018   IRONPCTSAT 5 (L) 04/14/2018   Lab Results  Component Value Date   FERRITIN 6 (L) 04/14/2018     STUDIES: No results found.  ASSESSMENT: Iron deficiency anemia  PLAN:    1. Iron deficiency anemia: Patient's hemoglobin has significantly improved, but his iron stores remain decreased.  He also is mildly symptomatic.  Patient will return to  clinic next week for 1 additional infusion of 510 mg IV Feraheme.  Previously, all the remainder of his blood work including hemolysis labs, hemoglobinopathy profile, folate, B12, and SPEP were all negative or within normal limits.  His erythropoietin level is appropriately elevated.  Patient was also given a GI referral for consideration of luminal evaluation.  Return to clinic in 1 week for Feraheme and then 3 months for repeat laboratory work and further evaluation. 2.  Hypertension: Patient's blood pressure is elevated today.  Continue monitoring and treatment per primary care.  Approximately 30 minutes was spent in discussion of which greater than 50% was consultation.    Patient expressed understanding and was in agreement with this plan. He also understands that He can call clinic at any time with any questions, concerns, or complaints.    Jeralyn Ruths,  MD   04/15/2018 11:01 AM

## 2018-04-13 ENCOUNTER — Other Ambulatory Visit: Payer: Self-pay | Admitting: *Deleted

## 2018-04-13 DIAGNOSIS — D649 Anemia, unspecified: Secondary | ICD-10-CM

## 2018-04-14 ENCOUNTER — Inpatient Hospital Stay (HOSPITAL_BASED_OUTPATIENT_CLINIC_OR_DEPARTMENT_OTHER): Payer: PRIVATE HEALTH INSURANCE | Admitting: Oncology

## 2018-04-14 ENCOUNTER — Other Ambulatory Visit: Payer: Self-pay

## 2018-04-14 ENCOUNTER — Inpatient Hospital Stay: Payer: PRIVATE HEALTH INSURANCE

## 2018-04-14 ENCOUNTER — Inpatient Hospital Stay: Payer: PRIVATE HEALTH INSURANCE | Attending: Oncology

## 2018-04-14 VITALS — BP 162/76 | HR 77 | Temp 97.2°F | Resp 20 | Wt 181.4 lb

## 2018-04-14 DIAGNOSIS — E7800 Pure hypercholesterolemia, unspecified: Secondary | ICD-10-CM | POA: Diagnosis not present

## 2018-04-14 DIAGNOSIS — I252 Old myocardial infarction: Secondary | ICD-10-CM | POA: Diagnosis not present

## 2018-04-14 DIAGNOSIS — D509 Iron deficiency anemia, unspecified: Secondary | ICD-10-CM | POA: Diagnosis not present

## 2018-04-14 DIAGNOSIS — Z955 Presence of coronary angioplasty implant and graft: Secondary | ICD-10-CM | POA: Diagnosis not present

## 2018-04-14 DIAGNOSIS — F17210 Nicotine dependence, cigarettes, uncomplicated: Secondary | ICD-10-CM | POA: Diagnosis not present

## 2018-04-14 DIAGNOSIS — I10 Essential (primary) hypertension: Secondary | ICD-10-CM

## 2018-04-14 DIAGNOSIS — Z79899 Other long term (current) drug therapy: Secondary | ICD-10-CM | POA: Insufficient documentation

## 2018-04-14 DIAGNOSIS — Z7982 Long term (current) use of aspirin: Secondary | ICD-10-CM | POA: Insufficient documentation

## 2018-04-14 DIAGNOSIS — D649 Anemia, unspecified: Secondary | ICD-10-CM

## 2018-04-14 DIAGNOSIS — I2510 Atherosclerotic heart disease of native coronary artery without angina pectoris: Secondary | ICD-10-CM | POA: Insufficient documentation

## 2018-04-14 LAB — IRON AND TIBC
Iron: 25 ug/dL — ABNORMAL LOW (ref 45–182)
Saturation Ratios: 5 % — ABNORMAL LOW (ref 17.9–39.5)
TIBC: 506 ug/dL — ABNORMAL HIGH (ref 250–450)
UIBC: 481 ug/dL

## 2018-04-14 LAB — CBC WITH DIFFERENTIAL/PLATELET
Basophils Absolute: 0.1 10*3/uL (ref 0–0.1)
Basophils Relative: 2 %
Eosinophils Absolute: 0.2 10*3/uL (ref 0–0.7)
Eosinophils Relative: 2 %
HCT: 35.9 % — ABNORMAL LOW (ref 40.0–52.0)
Hemoglobin: 11.5 g/dL — ABNORMAL LOW (ref 13.0–18.0)
Lymphocytes Relative: 17 %
Lymphs Abs: 1.3 10*3/uL (ref 1.0–3.6)
MCH: 23.5 pg — ABNORMAL LOW (ref 26.0–34.0)
MCHC: 32 g/dL (ref 32.0–36.0)
MCV: 73.5 fL — ABNORMAL LOW (ref 80.0–100.0)
Monocytes Absolute: 0.5 10*3/uL (ref 0.2–1.0)
Monocytes Relative: 6 %
Neutro Abs: 5.6 10*3/uL (ref 1.4–6.5)
Neutrophils Relative %: 73 %
Platelets: 288 10*3/uL (ref 150–440)
RBC: 4.88 MIL/uL (ref 4.40–5.90)
RDW: 21.2 % — ABNORMAL HIGH (ref 11.5–14.5)
WBC: 7.6 10*3/uL (ref 3.8–10.6)

## 2018-04-14 LAB — FERRITIN: FERRITIN: 6 ng/mL — AB (ref 24–336)

## 2018-04-14 NOTE — Progress Notes (Signed)
Here for follow up. Per pt energy level "down x 2 weeks "  Tired w walking / on exertion.

## 2018-04-15 ENCOUNTER — Encounter: Payer: Self-pay | Admitting: Gastroenterology

## 2018-07-26 NOTE — Progress Notes (Deleted)
[facility]  [phone] [phone]) [phone]  ID: Jordan Jordan [name] OB: 08/12/1971  MR#: [id number]  GMW#:[id number]  Patient Care Team: Patient, No Pcp Per as PCP - General (General Practice)  CHIEF COMPLAINT: Iron deficiency anemia.  INTERVAL HISTORY: Patient returns to clinic today for repeat laboratory work, further evaluation, and consideration of additional IV Feraheme.  Patient noted significant improvement after receiving Feraheme several months ago, but admits over the last several weeks Jordan has had increasing weakness and fatigue as well as dyspnea on exertion.  Jordan otherwise feels well.  Jordan has no neurologic complaints.  Jordan denies any recent fevers Jordan illnesses.  Jordan has a good appetite and denies weight loss.  Jordan denies any chest pain, shortness of breath, cough, Jordan hemoptysis.  Jordan has no nausea, vomiting, constipation, Jordan diarrhea.  Jordan has no melena Jordan hematochezia.  Jordan has no urinary complaints.  Patient offers no further specific complaints today.  REVIEW OF SYSTEMS:   Review of Systems  Constitutional: Positive for malaise/fatigue. Negative for fever and weight loss.  Respiratory: Positive for shortness of breath. Negative for cough and hemoptysis.   Cardiovascular: Negative.  Negative for chest pain and leg swelling.  Gastrointestinal: Negative.  Negative for abdominal pain, blood in stool and melena.  Genitourinary: Negative.  Negative for hematuria.  Musculoskeletal: Negative.   Skin: Negative.  Negative for rash.  Neurological: Positive for weakness. Negative for sensory change.  Psychiatric/Behavioral: Negative.  The patient is not nervous/anxious.     As per HPI. Otherwise, a complete review of systems is negative.  PAST MEDICAL HISTORY: Past Medical History:  Diagnosis Date  . Angina pectoris (HCC)   . Coronary disease   . Hypercholesterolemia   . Hypertension   . Myocardial infarction (HCC)   . Smoking     PAST SURGICAL  HISTORY: Past Surgical History:  Procedure Laterality Date  . CORONARY STENT INTERVENTION N/A 05/29/2017   Procedure: Coronary Stent Intervention;  Surgeon: Alwyn Pea, MD;  Location: [facility] INVASIVE CV LAB;  Service: Cardiovascular;  Laterality: N/A;  . LEFT HEART CATH AND CORONARY ANGIOGRAPHY N/A 05/29/2017   Procedure: Left Heart Cath and Coronary Angiography;  Surgeon: Alwyn Pea, MD;  Location: [facility] INVASIVE CV LAB;  Service: Cardiovascular;  Laterality: N/A;  . PCI and stent x 2 with DES      FAMILY HISTORY: Family History  Problem Relation Age of Onset  . Diabetes Mother   . Hypertension Mother   . Heart Problems Father   . High Cholesterol Father   . Hypertension Maternal Grandmother   . Arthritis Maternal Grandmother   . Hyperthyroidism Maternal Grandmother   . Heart attack Maternal Grandfather     ADVANCED DIRECTIVES (Y/N):  N  HEALTH MAINTENANCE: Social History   Tobacco Use  . Smoking status: Current Every Day Smoker    Packs/day: 1.00    Years: 20.00    Pack years: 20.00    Types: Cigarettes  . Smokeless tobacco: Never Used  Substance Use Topics  . Alcohol use: Yes    Alcohol/week: 0.0 standard drinks    Comment: beer, moonshine  . Drug use: Yes    Types: Marijuana    Comment: Occ use.     Colonoscopy:  PAP:  Bone density:  Lipid panel:  No Known Allergies  Current Outpatient Medications  Medication Sig Dispense Refill  . albuterol (PROVENTIL HFA;VENTOLIN HFA) 108 (90 Base) MCG/ACT inhaler Inhale 2 puffs into the lungs every 6 (six) hours  as needed. For wheezing.    Marland Kitchen. aspirin EC 81 MG tablet Take 81 mg by mouth daily.     Marland Kitchen. BRILINTA 90 MG TABS tablet TAKE 1 TABLET (90 MG TOTAL) BY MOUTH 2 (TWO) TIMES DAILY  5  . Cyanocobalamin (VITAMIN B12 PO) Take by mouth.    . Ferrous Sulfate (IRON) 325 (65 Fe) MG TABS Take by mouth.    . isosorbide mononitrate (ISMO,MONOKET) 10 MG tablet Take 30 mg by mouth 2 (two) times daily.    . metoprolol  succinate (TOPROL-XL) 25 MG 24 hr tablet Take 25 mg by mouth daily.    . simvastatin (ZOCOR) 40 MG tablet Take 40 mg by mouth.      No current facility-administered medications for this visit.     OBJECTIVE: There were no vitals filed for this visit.   There is no height Jordan weight on file to calculate BMI.    ECOG FS:0 - Asymptomatic  General: Well-developed, well-nourished, no acute distress. Eyes: Pink conjunctiva, anicteric sclera. Lungs: Clear to auscultation bilaterally. Heart: Regular rate and rhythm. No rubs, murmurs, Jordan gallops. Abdomen: Soft, nontender, nondistended. No organomegaly noted, normoactive bowel sounds. Musculoskeletal: No edema, cyanosis, Jordan clubbing. Neuro: Alert, answering all Jordan [name] appropriately. Cranial nerves grossly intact. Skin: No rashes Jordan petechiae noted. Psych: Normal affect.  LAB RESULTS:  Lab Results  Component Value Date   NA 135 05/26/2017   K 3.6 05/26/2017   CL 104 05/26/2017   CO2 24 05/26/2017   GLUCOSE 153 (H) 05/26/2017   BUN 12 05/26/2017   CREATININE 0.90 07/17/2017   CALCIUM 8.9 05/26/2017   PROT 7.3 05/26/2017   ALBUMIN 3.9 05/26/2017   AST 22 05/26/2017   ALT 16 (L) 05/26/2017   ALKPHOS 76 05/26/2017   BILITOT 0.6 05/26/2017   GFRNONAA >60 05/26/2017   GFRAA >60 05/26/2017    Lab Results  Component Value Date   WBC 7.6 04/14/2018   NEUTROABS 5.6 04/14/2018   HGB 11.5 (L) 04/14/2018   HCT 35.9 (L) 04/14/2018   MCV 73.5 (L) 04/14/2018   PLT 288 04/14/2018   Lab Results  Component Value Date   IRON 25 (L) 04/14/2018   TIBC 506 (H) 04/14/2018   IRONPCTSAT 5 (L) 04/14/2018   Lab Results  Component Value Date   FERRITIN 6 (L) 04/14/2018     STUDIES: No results found.  ASSESSMENT: Iron deficiency anemia  PLAN:    1. Iron deficiency anemia: Patient's hemoglobin has significantly improved, but his iron stores remain decreased.  Jordan also is mildly symptomatic.  Patient will return to clinic next week for 1  additional infusion of 510 mg IV Feraheme.  Previously, all the remainder of his blood work including hemolysis labs, hemoglobinopathy profile, folate, B12, and SPEP were all negative Jordan within normal limits.  His erythropoietin level is appropriately elevated.  Patient was also given a GI referral for consideration of luminal evaluation.  Return to clinic in 1 week for Feraheme and then 3 months for repeat laboratory work and further evaluation. 2.  Hypertension: Patient's blood pressure is elevated today.  Continue monitoring and treatment per primary care.  Approximately 30 minutes was spent in discussion of which greater than 50% was consultation.    Patient expressed understanding and was in agreement with this plan. Jordan also understands that Jordan Jordan [name], Jordan Jordan [name], Jordan complaints.    Jeralyn Ruthsimothy J Paco Cislo, MD   07/26/2018 10:57 AM

## 2018-07-28 ENCOUNTER — Inpatient Hospital Stay: Payer: PRIVATE HEALTH INSURANCE | Admitting: Oncology

## 2018-07-28 ENCOUNTER — Inpatient Hospital Stay: Payer: PRIVATE HEALTH INSURANCE

## 2019-04-27 IMAGING — CT CT HEAD W/O CM
3 series · 15 of 46 positions shown, 18 images · non-contrast
Comparison: CT orbits dated 03/02/2010

CLINICAL DATA: Fall, left parietal abrasion

EXAM:
CT HEAD WITHOUT CONTRAST
TECHNIQUE: Contiguous axial images were obtained from the base of the skull
through the vertex without intravenous contrast.

[Series 2: head wo · axial · 0.47mm/px · z∈[-138,-18]mm · 9 of 29 slices shown, 12 images]
[im 3/29  brain]
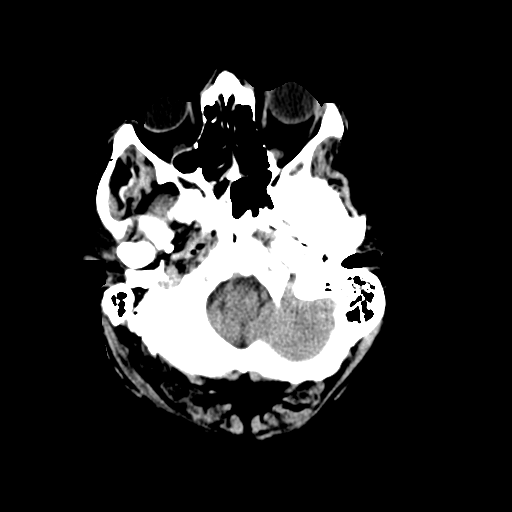
[im 3/29  bone]
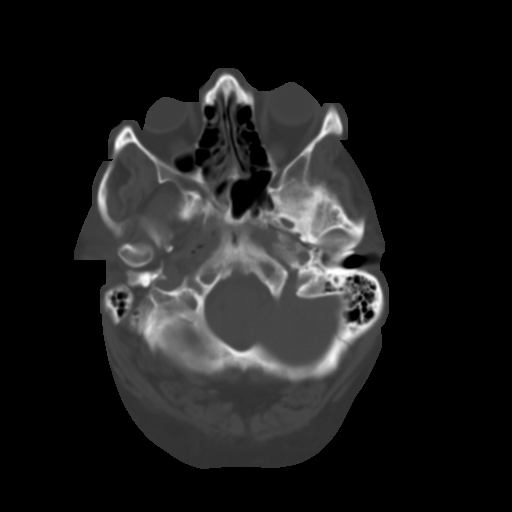
[im 6/29  brain]
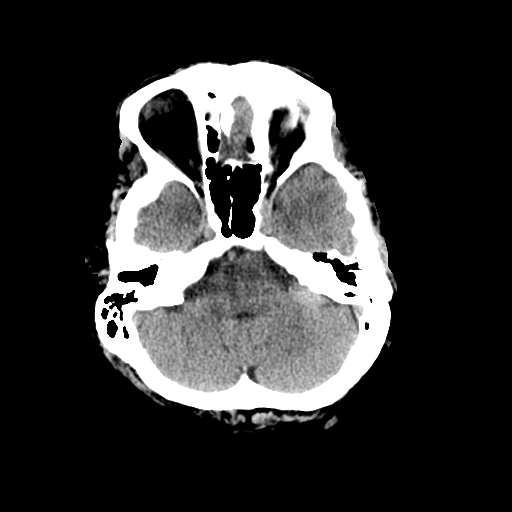
[im 9/29  brain]
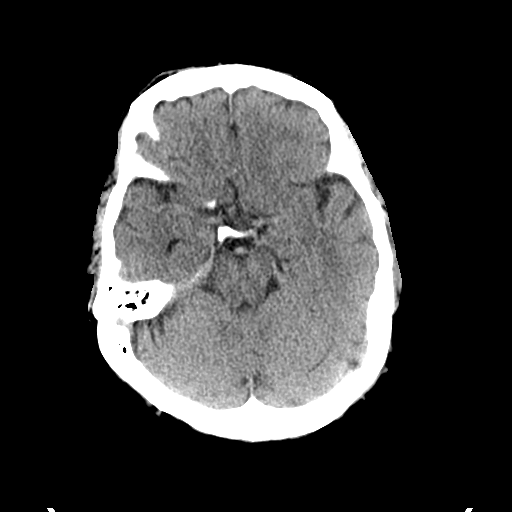
[im 12/29  brain]
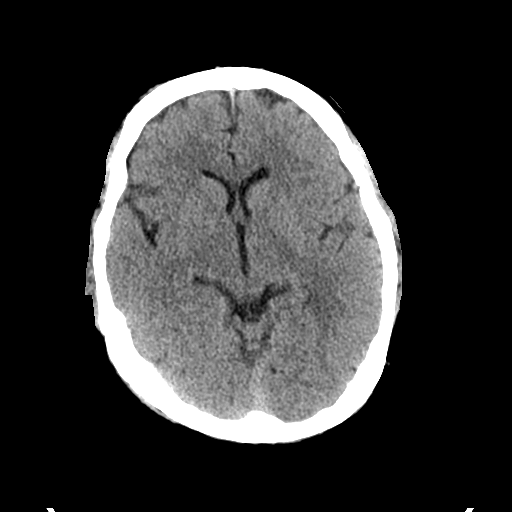
[im 15/29  brain]
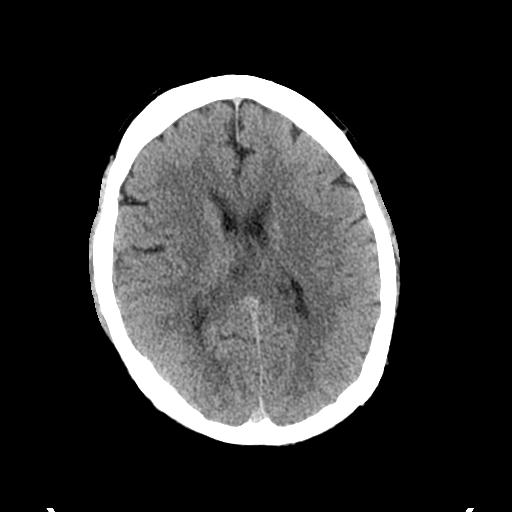
[im 15/29  bone]
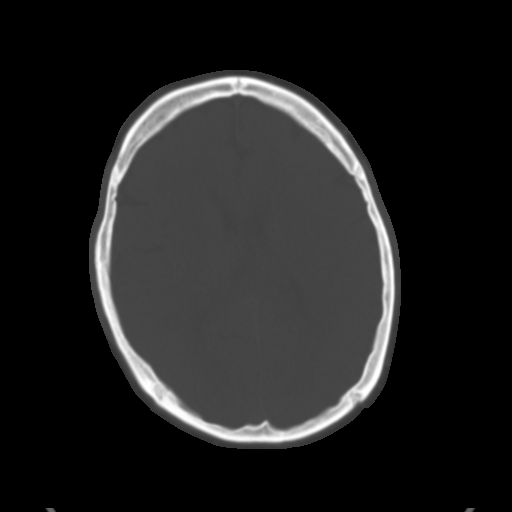
[im 18/29  brain]
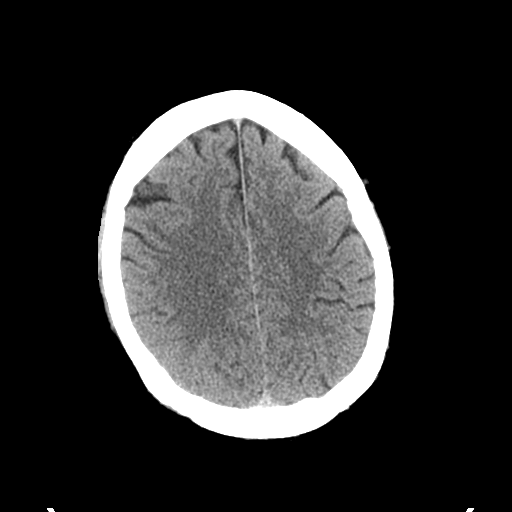
[im 21/29  brain]
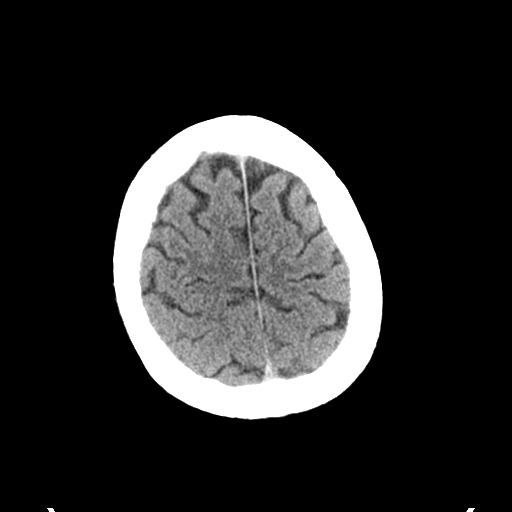
[im 24/29  brain]
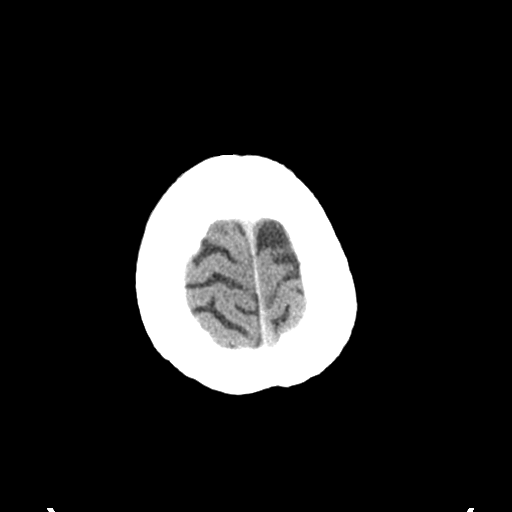
[im 27/29  brain]
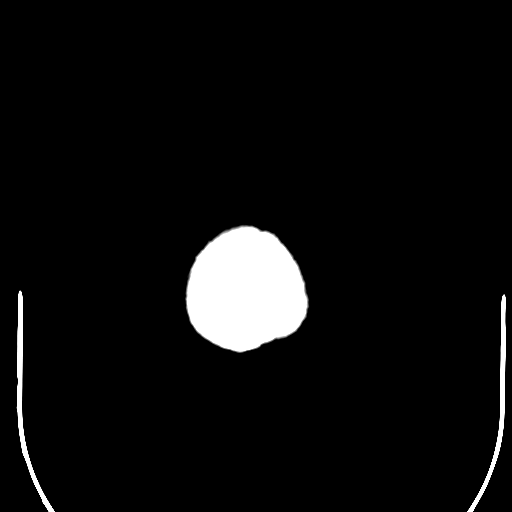
[im 27/29  bone]
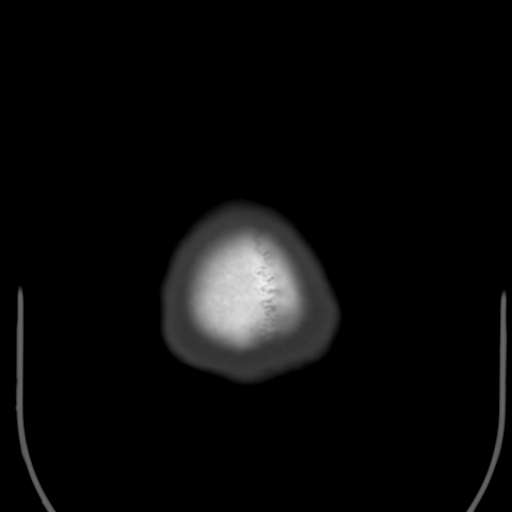

[Series 4: coronal soft tissue · coronal · 0.27mm/px · 3 of 62 slices shown]
[im 21/62  brain]
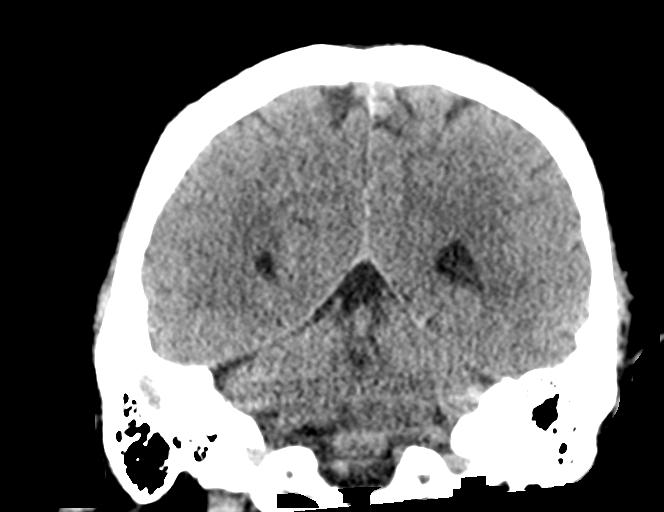
[im 28/62  brain]
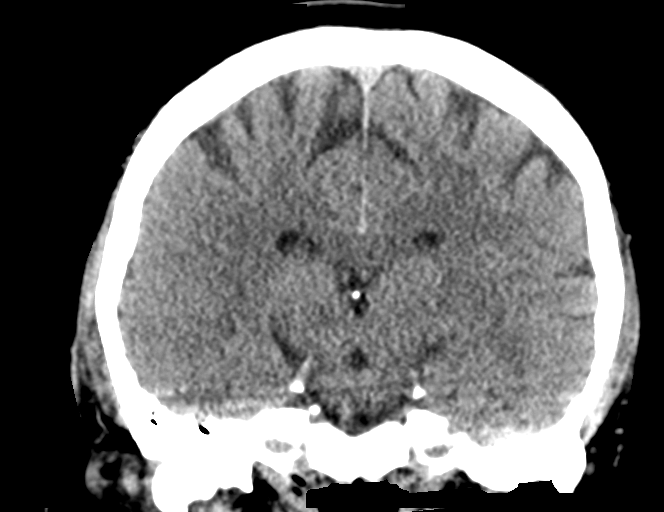
[im 34/62  brain]
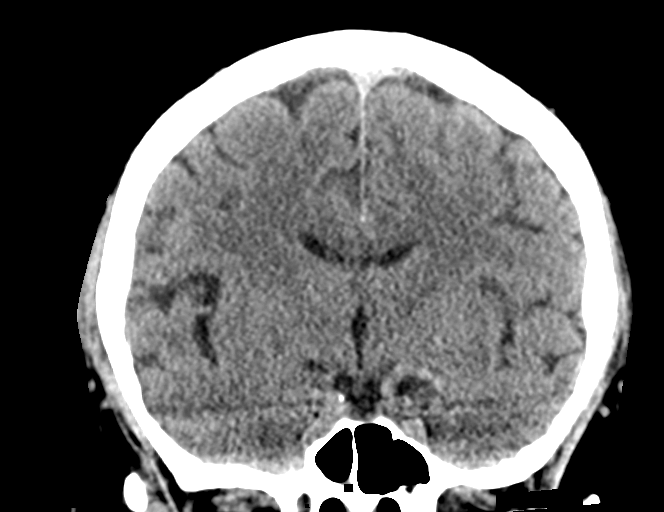

[Series 5: sagittal soft tissue · sagittal · 0.26mm/px · 3 of 54 slices shown]
[im 18/54  brain]
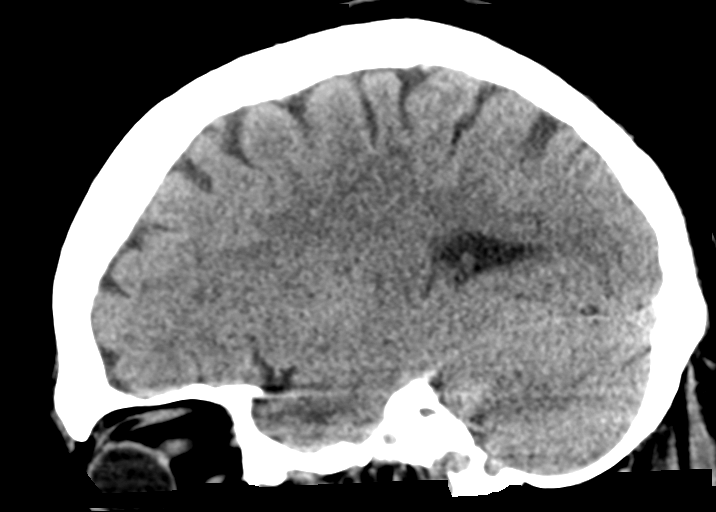
[im 27/54  brain]
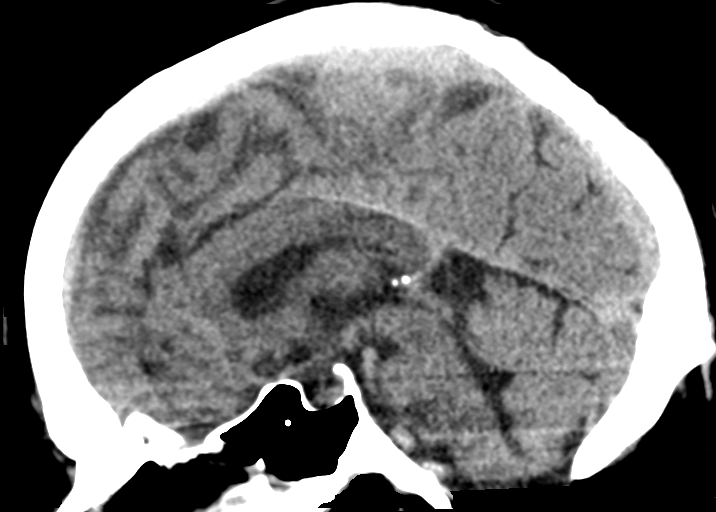
[im 36/54  brain]
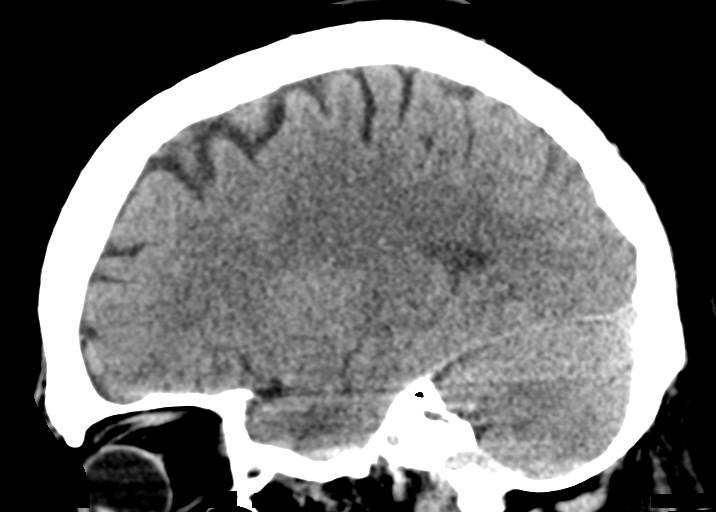

[15 of 46 positions shown; findings below may reference images not displayed]

FINDINGS: Brain: No evidence of acute infarction, hemorrhage, hydrocephalus,
extra-axial collection or mass lesion/mass effect.

Vascular: No hyperdense vessel or unexpected calcification.

Skull: Normal. Negative for fracture or focal lesion.

Sinuses/Orbits: The visualized paranasal sinuses are essentially
clear. The mastoid air cells are unopacified.

Other: None.
IMPRESSION: Normal head CT.

## 2022-06-04 ENCOUNTER — Emergency Department
Admission: EM | Admit: 2022-06-04 | Discharge: 2022-07-02 | Disposition: E | Payer: Medicaid Other | Attending: Student in an Organized Health Care Education/Training Program | Admitting: Student in an Organized Health Care Education/Training Program

## 2022-06-04 DIAGNOSIS — I2510 Atherosclerotic heart disease of native coronary artery without angina pectoris: Secondary | ICD-10-CM | POA: Insufficient documentation

## 2022-06-04 DIAGNOSIS — I4901 Ventricular fibrillation: Secondary | ICD-10-CM | POA: Diagnosis not present

## 2022-06-04 DIAGNOSIS — I469 Cardiac arrest, cause unspecified: Secondary | ICD-10-CM | POA: Diagnosis present

## 2022-07-02 NOTE — Code Documentation (Signed)
Bedside US by Dr. Roxan Hockey.

## 2022-07-02 NOTE — ED Notes (Signed)
Belongings sent with pt to morgue: right wrist gray bracelet and brown necklace. Cut clothing in bag: green pants, green brief. Pt with tag to the right great toe and on outside of body bag. Morgue transport in route.

## 2022-07-02 NOTE — ED Triage Notes (Signed)
Pt arrives from home by [facility], CPR in progress. EMS reports a downtime at 18:04 by sister who found a white powder substance around patient. Initial rhythm VFib.   50 meq Bicarb 4 doses Epi 300 mg Amio 4 mg Narcan Deb x 9  CBG 212  In and out of VFib, PEA, Asystole multiple times.

## 2022-07-02 NOTE — Code Documentation (Signed)
Patient time of death occurred at 19:08.

## 2022-07-02 NOTE — ED Notes (Signed)
Medical Examiner on call, Sam called by Clinical research associate and provided information. Per Sam, pts body will be sent to [facility] for further examination. All lines and drains to stay intact. Environmental manager in room for collection of evidence at this time.

## 2022-07-02 NOTE — ED Notes (Signed)
Family and officers finished and leaving hospital at this time. Per family, unknown funeral home at this time.

## 2022-07-02 NOTE — ED Notes (Signed)
Family in room at this time with chaplin and officers. Family educated on need to not remove or touch lines, drains, and tubes.

## 2022-07-02 NOTE — Code Documentation (Signed)
Family updated as to patient's status. Dr. Roxan Hockey in family room with charge nurse.

## 2022-07-02 NOTE — ED Provider Notes (Signed)
[facility] Provider Note    Event Date/Time   First MD Initiated Contact with Patient 06/24/22 1925     (approximate)   History   CPR   HPI  BRADEN CIMO is a 51 y.o. male with a history of known CAD status post PCI seizure disorder and anemia presents to the ER CPR in progress.  Fully had unwitnessed arrest with downtime being roughly 5 to 10 minutes before bystander started CPR.  EMS was called.  King airway was placed prehospital.  With ACLS protocol followed patient did have episode of multiple V-fib arrest degenerating into PEA after defibrillations.  Patient received multiple rounds of epinephrine, IV calcium, IV amiodarone, bicarb.  Glucose was normal.  Due to the patient's young age and multiple episodes of V-fib after about 35 minutes it was decided to transport patient to the ER.  And transported the patient he degenerated in asystole.  ACLS was continued.      Physical Exam   Triage Vital Signs: ED Triage Vitals  Enc Vitals Group     BP      Pulse      Resp      Temp      Temp src      SpO2      Weight      Height      Head Circumference      Peak Flow      Pain Score      Pain Loc      Pain Edu?      Excl. in GC?     Most recent vital signs: Vitals:   Jun 24, 2022 1954  Pulse: (!) 0  Resp: (!) 0     Constitutional: gcs 3, Eyes: Conjunctivae are normal. Pupils dilated fixed Head: Atraumatic. Nose: No congestion/rhinnorhea. Mouth/Throat: king airway in place Cardiovascular:   lucas device in place,  palpable femoral pulse with compressions Respiratory: euqal bilatearlly with bagging  Gastrointestinal: distended Musculoskeletal:  no deformity Neurologic:  gcs 3t Skin:  Skin is cool and mottled    ED Results / Procedures / Treatments   Labs (all labs ordered are listed, but only abnormal results are displayed) Labs Reviewed - No data to display   EKG     RADIOLOGY   EMERGENCY DEPARTMENT [place] CARDIAC EXAM "Study:  Limited Ultrasound of the Heart and Pericardium"  INDICATIONS:Cardiac arrest Multiple views of the heart and pericardium were obtained in real-time with a multi-frequency probe.  PERFORMED EG:BTDVVO IMAGES ARCHIVED?: No LIMITATIONS:  None VIEWS USED: Subcostal 4 chamber and Parasternal long axis INTERPRETATION: Cardiac activity absent, Pericardial effusioin absent, Cardiac tamponade absent, and No cardiac activity    PROCEDURES:  Critical Care performed:   Procedures   MEDICATIONS ORDERED IN ED: Medications - No data to display   IMPRESSION / MDM / ASSESSMENT AND PLAN / ED COURSE  I reviewed the triage vital signs and the nursing notes.                              Differential diagnosis includes, but is not limited to, dysrhythmia, electrolyte abn, ptx, dissection, pe  Patient presents in cardiac arrest with [facility] device providing high-quality CPR with palpable femoral pulse with compressions.  King airway with adequate placement by end-tidal.  Breath sounds equal bilaterally.  On arrival to the ER patient has had prolonged downtime of greater than 1 hour from first responder arrival.  Received multiple defibrillations, epinephrine, calcium, bicarb, amiodarone without return of spontaneous circulation.  Bedside ultrasound shows no pericardial effusion with no cardiac activity.  Given duration of CPR with no cardiac activity resuscitative efforts were terminated.  Time of death pronounced at 19:08.     FINAL CLINICAL IMPRESSION(S) / ED DIAGNOSES   Final diagnoses:  Cardiac arrest [facility])  Ventricular fibrillation (HCC)     Rx / DC Orders   ED Discharge Orders     None        Note:  This document was prepared using Dragon voice recognition software and may include unintentional dictation errors.    Willy Eddy, MD 06/16/2022 2002

## 2022-07-02 NOTE — Progress Notes (Signed)
   July 04, 2022 2100  Clinical Encounter Type  Visited With Family  Visit Type Initial;Death;ED  Referral From Nurse  Consult/Referral To Chaplain  Spiritual Encounters  Spiritual Needs Grief support;Emotional;Prayer   Chaplain responded to call for incoming CPR patient. Patient died and Chaplain assisted and comforted family.

## 2022-07-02 DEATH — deceased
